# Patient Record
Sex: Male | Born: 1960 | Race: Black or African American | Hispanic: No | Smoking: Former smoker
Health system: Southern US, Community
[De-identification: ages and names within clinical notes are randomized; demographics above are authoritative.]

## PROBLEM LIST (undated history)

## (undated) DIAGNOSIS — I10 Essential (primary) hypertension: Secondary | ICD-10-CM

## (undated) DIAGNOSIS — K8020 Calculus of gallbladder without cholecystitis without obstruction: Secondary | ICD-10-CM

## (undated) DIAGNOSIS — K264 Chronic or unspecified duodenal ulcer with hemorrhage: Secondary | ICD-10-CM

## (undated) DIAGNOSIS — E119 Type 2 diabetes mellitus without complications: Secondary | ICD-10-CM

## (undated) HISTORY — PX: FRACTURE SURGERY: SHX138

---

## 1998-06-01 ENCOUNTER — Inpatient Hospital Stay (HOSPITAL_COMMUNITY): Admission: AD | Admit: 1998-06-01 | Discharge: 1998-06-04 | Payer: Self-pay | Admitting: Internal Medicine

## 1998-10-20 ENCOUNTER — Encounter: Payer: Self-pay | Admitting: Emergency Medicine

## 1998-10-20 ENCOUNTER — Observation Stay (HOSPITAL_COMMUNITY): Admission: EM | Admit: 1998-10-20 | Discharge: 1998-10-21 | Payer: Self-pay | Admitting: Emergency Medicine

## 1998-10-20 ENCOUNTER — Encounter: Payer: Self-pay | Admitting: Specialist

## 1998-11-28 ENCOUNTER — Encounter: Payer: Self-pay | Admitting: *Deleted

## 1998-11-28 ENCOUNTER — Emergency Department (HOSPITAL_COMMUNITY): Admission: EM | Admit: 1998-11-28 | Discharge: 1998-11-28 | Payer: Self-pay | Admitting: *Deleted

## 1999-01-03 ENCOUNTER — Encounter: Admission: RE | Admit: 1999-01-03 | Discharge: 1999-04-03 | Payer: Self-pay | Admitting: Specialist

## 1999-01-12 ENCOUNTER — Encounter: Admission: RE | Admit: 1999-01-12 | Discharge: 1999-03-29 | Payer: Self-pay | Admitting: Specialist

## 1999-01-17 ENCOUNTER — Ambulatory Visit (HOSPITAL_COMMUNITY): Admission: RE | Admit: 1999-01-17 | Discharge: 1999-01-17 | Payer: Self-pay | Admitting: Specialist

## 1999-01-17 ENCOUNTER — Encounter: Payer: Self-pay | Admitting: Specialist

## 1999-03-23 ENCOUNTER — Encounter: Payer: Self-pay | Admitting: Specialist

## 1999-03-23 ENCOUNTER — Ambulatory Visit (HOSPITAL_COMMUNITY): Admission: RE | Admit: 1999-03-23 | Discharge: 1999-03-23 | Payer: Self-pay | Admitting: Specialist

## 1999-06-07 ENCOUNTER — Encounter: Admission: RE | Admit: 1999-06-07 | Discharge: 1999-08-02 | Payer: Self-pay | Admitting: Specialist

## 1999-07-21 ENCOUNTER — Encounter: Admission: RE | Admit: 1999-07-21 | Discharge: 1999-08-02 | Payer: Self-pay | Admitting: Specialist

## 1999-09-28 ENCOUNTER — Emergency Department (HOSPITAL_COMMUNITY): Admission: EM | Admit: 1999-09-28 | Discharge: 1999-09-28 | Payer: Self-pay | Admitting: Emergency Medicine

## 2004-09-06 ENCOUNTER — Emergency Department (HOSPITAL_COMMUNITY): Admission: EM | Admit: 2004-09-06 | Discharge: 2004-09-06 | Payer: Self-pay | Admitting: Emergency Medicine

## 2006-04-14 ENCOUNTER — Emergency Department (HOSPITAL_COMMUNITY): Admission: EM | Admit: 2006-04-14 | Discharge: 2006-04-14 | Payer: Self-pay | Admitting: Emergency Medicine

## 2006-10-31 ENCOUNTER — Inpatient Hospital Stay (HOSPITAL_COMMUNITY): Admission: AC | Admit: 2006-10-31 | Discharge: 2006-11-04 | Payer: Self-pay

## 2010-08-14 ENCOUNTER — Emergency Department (HOSPITAL_COMMUNITY): Admission: EM | Admit: 2010-08-14 | Discharge: 2010-08-14 | Payer: Self-pay | Admitting: Emergency Medicine

## 2010-08-17 ENCOUNTER — Emergency Department (HOSPITAL_COMMUNITY): Admission: EM | Admit: 2010-08-17 | Discharge: 2010-08-17 | Payer: Self-pay | Admitting: Emergency Medicine

## 2010-12-13 LAB — DIFFERENTIAL
Basophils Absolute: 0 10*3/uL (ref 0.0–0.1)
Basophils Relative: 0 % (ref 0–1)
Eosinophils Relative: 0 % (ref 0–5)
Monocytes Absolute: 0.8 10*3/uL (ref 0.1–1.0)
Monocytes Relative: 5 % (ref 3–12)

## 2010-12-13 LAB — CBC
HCT: 44.1 % (ref 39.0–52.0)
Hemoglobin: 15.3 g/dL (ref 13.0–17.0)
MCHC: 34.7 g/dL (ref 30.0–36.0)
MCV: 97.5 fL (ref 78.0–100.0)
RDW: 12.3 % (ref 11.5–15.5)

## 2010-12-13 LAB — BASIC METABOLIC PANEL
BUN: 16 mg/dL (ref 6–23)
CO2: 29 mEq/L (ref 19–32)
Chloride: 102 mEq/L (ref 96–112)
Glucose, Bld: 163 mg/dL — ABNORMAL HIGH (ref 70–99)
Potassium: 4.1 mEq/L (ref 3.5–5.1)
Sodium: 143 mEq/L (ref 135–145)

## 2011-01-01 ENCOUNTER — Emergency Department (HOSPITAL_COMMUNITY): Payer: Self-pay

## 2011-01-01 ENCOUNTER — Inpatient Hospital Stay (HOSPITAL_COMMUNITY)
Admission: EM | Admit: 2011-01-01 | Discharge: 2011-01-02 | DRG: 066 | Disposition: A | Discharge status: Home / Self Care | Payer: Self-pay | Attending: Infectious Diseases | Admitting: Infectious Diseases

## 2011-01-01 ENCOUNTER — Other Ambulatory Visit (HOSPITAL_COMMUNITY): Payer: Self-pay

## 2011-01-01 ENCOUNTER — Encounter: Payer: Self-pay | Admitting: Internal Medicine

## 2011-01-01 DIAGNOSIS — Z7982 Long term (current) use of aspirin: Secondary | ICD-10-CM

## 2011-01-01 DIAGNOSIS — I6529 Occlusion and stenosis of unspecified carotid artery: Secondary | ICD-10-CM | POA: Diagnosis present

## 2011-01-01 DIAGNOSIS — E119 Type 2 diabetes mellitus without complications: Secondary | ICD-10-CM

## 2011-01-01 DIAGNOSIS — I10 Essential (primary) hypertension: Secondary | ICD-10-CM | POA: Diagnosis present

## 2011-01-01 DIAGNOSIS — I6350 Cerebral infarction due to unspecified occlusion or stenosis of unspecified cerebral artery: Secondary | ICD-10-CM

## 2011-01-01 DIAGNOSIS — I517 Cardiomegaly: Secondary | ICD-10-CM | POA: Diagnosis present

## 2011-01-01 DIAGNOSIS — E876 Hypokalemia: Secondary | ICD-10-CM | POA: Diagnosis present

## 2011-01-01 DIAGNOSIS — E785 Hyperlipidemia, unspecified: Secondary | ICD-10-CM | POA: Diagnosis present

## 2011-01-01 LAB — GLUCOSE, CAPILLARY
Glucose-Capillary: 176 mg/dL — ABNORMAL HIGH (ref 70–99)
Glucose-Capillary: 270 mg/dL — ABNORMAL HIGH (ref 70–99)

## 2011-01-01 LAB — URINALYSIS, ROUTINE W REFLEX MICROSCOPIC
Bilirubin Urine: NEGATIVE
Glucose, UA: >1000 mg/dL — AB
Ketones, ur: NEGATIVE mg/dL
Protein, ur: >300 mg/dL — AB

## 2011-01-01 LAB — CBC
HCT: 43.4 % (ref 39.0–52.0)
MCH: 33.2 pg (ref 26.0–34.0)
MCV: 94.1 fL (ref 78.0–100.0)
Platelets: 312 10*3/uL (ref 150–400)
RBC: 4.61 MIL/uL (ref 4.22–5.81)
WBC: 7.1 10*3/uL (ref 4.0–10.5)

## 2011-01-01 LAB — COMPREHENSIVE METABOLIC PANEL
AST: 27 U/L (ref 0–37)
Albumin: 3.3 g/dL — ABNORMAL LOW (ref 3.5–5.2)
BUN: 14 mg/dL (ref 6–23)
Calcium: 8.9 mg/dL (ref 8.4–10.5)
Creatinine, Ser: 1.49 mg/dL (ref 0.4–1.5)
GFR calc Af Amer: >60 mL/min (ref >60)
Total Bilirubin: 1 mg/dL (ref 0.3–1.2)
Total Protein: 6.7 g/dL (ref 6.0–8.3)

## 2011-01-01 LAB — DIFFERENTIAL
Eosinophils Absolute: 0.1 10*3/uL (ref 0.0–0.7)
Eosinophils Relative: 1 % (ref 0–5)
Lymphocytes Relative: 27 % (ref 12–46)
Lymphs Abs: 1.9 10*3/uL (ref 0.7–4.0)
Monocytes Relative: 8 % (ref 3–12)
Neutrophils Relative %: 63 % (ref 43–77)

## 2011-01-01 LAB — URINE MICROSCOPIC-ADD ON

## 2011-01-01 LAB — POCT CARDIAC MARKERS
CKMB, poc: 1.3 ng/mL (ref 1.0–8.0)
Troponin i, poc: <0.05 ng/mL (ref 0.00–0.09)

## 2011-01-01 LAB — APTT: aPTT: 28 seconds (ref 24–37)

## 2011-01-01 LAB — PROTIME-INR
INR: 0.93 (ref 0.00–1.49)
Prothrombin Time: 12.7 seconds (ref 11.6–15.2)
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
## 2011-01-02 ENCOUNTER — Inpatient Hospital Stay (HOSPITAL_COMMUNITY): Payer: Self-pay

## 2011-01-02 DIAGNOSIS — I6350 Cerebral infarction due to unspecified occlusion or stenosis of unspecified cerebral artery: Secondary | ICD-10-CM

## 2011-01-02 LAB — BASIC METABOLIC PANEL
Calcium: 8.4 mg/dL (ref 8.4–10.5)
GFR calc non Af Amer: 59 mL/min — ABNORMAL LOW (ref >60)
Glucose, Bld: 194 mg/dL — ABNORMAL HIGH (ref 70–99)
Sodium: 135 mEq/L (ref 135–145)

## 2011-01-02 LAB — CBC
HCT: 42.7 % (ref 39.0–52.0)
MCHC: 35.4 g/dL (ref 30.0–36.0)
Platelets: 320 10*3/uL (ref 150–400)
RDW: 12 % (ref 11.5–15.5)

## 2011-01-02 LAB — GLUCOSE, CAPILLARY
Glucose-Capillary: 175 mg/dL — ABNORMAL HIGH (ref 70–99)
Glucose-Capillary: 254 mg/dL — ABNORMAL HIGH (ref 70–99)

## 2011-01-02 LAB — HEMOGLOBIN A1C: Mean Plasma Glucose: 203 mg/dL — ABNORMAL HIGH (ref <117)

## 2011-01-02 LAB — LIPID PANEL
Cholesterol: 211 mg/dL — ABNORMAL HIGH (ref 0–200)
Triglycerides: 148 mg/dL (ref <150)

## 2011-01-02 LAB — MAGNESIUM: Magnesium: 1.8 mg/dL (ref 1.5–2.5)

## 2011-01-02 MED ORDER — GADOBENATE DIMEGLUMINE 529 MG/ML IV SOLN
20.0000 mL | Freq: Once | INTRAVENOUS | Status: AC
  Administered 2011-01-02: 20 mL via INTRAVENOUS

## 2011-01-03 MED ORDER — GADOBENATE DIMEGLUMINE 529 MG/ML IV SOLN
20.0000 mL | Freq: Once | INTRAVENOUS | Status: AC
  Administered 2011-01-02: 20 mL via INTRAVENOUS

## 2011-01-04 ENCOUNTER — Ambulatory Visit: Payer: Self-pay | Attending: Infectious Diseases

## 2011-01-04 DIAGNOSIS — IMO0001 Reserved for inherently not codable concepts without codable children: Secondary | ICD-10-CM | POA: Insufficient documentation

## 2011-01-04 DIAGNOSIS — I69920 Aphasia following unspecified cerebrovascular disease: Secondary | ICD-10-CM | POA: Insufficient documentation

## 2011-01-10 ENCOUNTER — Ambulatory Visit: Payer: Self-pay

## 2011-01-16 NOTE — Discharge Summary (Signed)
[name], [name] NO.:  [id number]  MEDICAL RECORD NO.:  [id number]           PATIENT TYPE:  I  LOCATION:  3003                         FACILITY:  [facility]  PHYSICIAN:  Fransisco Hertz, M.D.  DATE OF BIRTH:  30-Aug-1961  DATE OF ADMISSION:  01/01/2011 DATE OF DISCHARGE:  01/02/2011                              DISCHARGE SUMMARY   DISCHARGE DIAGNOSES: 1. Left-sided ischemic stroke secondary to occlusion of the left     middle cerebral artery (M2 branch). 2. Hypertension. 3. Diabetes mellitus. 4. Hyperlipidemia. 5. Hypokalemia.  DISCHARGE MEDICATIONS: 1. Acetaminophen 325 mg by mouth every 4 hours as needed. 2. Aspirin 81 mg tablet 2 by mouth daily. 3. Hydrochlorothiazide 12.5 mg by mouth daily. 4. Lisinopril 20 mg tablet by mouth daily. 5. Simvastatin 20 mg daily. 6. Amlodipine 10 mg by mouth daily. 7. Metformin 1000 mg one tablet by mouth twice daily.  DISPOSITION AND FOLLOWUP:  [name] [name] was discharged from [facility] in stable and improved condition.  He will have a followup appointment within 7 days with Dr. Bruna Potter.  At this appointment, they should discuss his symptoms and if they are resolving, he should also evaluate the need for an outpatient transesophageal echocardiogram and hypocoagulable workup.  He should also establish long- term care for his diabetes, hypertension, and hyperlipidemia.  The patient will also receive outpatient speech therapy and his progress with this therapy can also be discussed.  CONSULTATIONS:  Neurology.  PROCEDURES PERFORMED: 1. Head CT on January 01, 2011:  Bilateral vague hypoattenuating lesions     with the largest in the left temporal lobe which were nonspecific     and related to acute or subacute area of infarction. 2. Brain MRI, January 01, 2011:     a.     Acute/subacute infarcts involving the left temporal,      posterior frontal, parietal lobes, along the sylvian fissure.         I.  Extensive white matter disease is present elsewhere.  This             is nonspecific, but raises concern for chronic             microvascular ischemia. 3. Head MRA on January 01, 2011:     a.     Left inferior M2 branch vessel occlusion.  No other acute or      focal lesions are evident. 4. Neck MRA on January 02, 2011:  Focal concentric plaque of the proximal     left internal carotid artery nearing the lumen by approximately 20%     diameter stenosis.  No significant right carotid or vertebral     artery stenosis. 5. A 12-lead EKG on January 02, 2011:  Normal sinus rhythm with left     ventricular hypertrophy.  CHIEF COMPLAINT:  "Trouble finding words."  HISTORY OF PRESENT ILLNESS:  [name] [name] is a 50 year old male with past medical history significant for diabetes and hypertension who presents with a 2-day history of problems finding words.  The patient states that they began suddenly 2 days ago, and  his main complaint is that he can understand questions or statements made to him, but he cannot always find the appropriate words for reply.  During the interview, there were several incidents of the patient substituting words and he appears to be searching for words with great effort.  He feels that this problem is getting better, but decided to go to urgent care today because he had problems with co-workers understand him at work yesterday.  He states that he was sought at the urgent care that he needs to come to the ED because they "could not do anything for him there."  The patient also admits to an accompanying headache over the past couple of days that is not relieved by Tylenol.  He also has started Tylenol PM at night because he has been unable to sleep during the same time.  He has never experienced symptoms like these in the past.  The patient endorses taking medication as prescribed with the exception of metformin, it is prescribed as 1000 mg b.i.d., the patient states that he  checks his blood sugars once every 2-3 days.  Whenever this number is high (in the 190s), he takes metformin, but he does not take it if his blood sugar is "normal" or on days that he has not checked his sugars.  The patient denies numbness, tingling, blurred vision, muscle weakness, or facial droop.  ADMISSION PHYSICAL EXAMINATION:  VITAL SIGNS:  Temperature 97.6, pulse 86, BP 164/92, respiratory rate 18, sats 99% on room air. GENERAL:  The patient is a well-developed and well-nourished man in no acute distress and cooperative with the exam, alert and oriented x3. HEENT:  Eyes:  PERRL, EOMI, conjunctivae normal, no scleral icterus. NECK:  Supple, trachea midline, normal range of motion, no JVD, mass, thyromegaly, or carotid bruits present. CARDIOVASCULAR:  RRR, S1 normal, S2 normal, pulse is symmetric and intact bilaterally. PULMONARY:  CTAB.  No wheezes, rales, or rhonchi. ABDOMEN:  Soft, nontender, nondistended, and bowel sounds are normal with no masses, organomegaly, or guarding present. MUSCULOSKELETAL:  No joint deformities, erythema, or stiffness.  Range of motion full. NEUROLOGIC:  Alert and oriented x3.  Strength is normal and symmetric bilaterally, cranial nerves II through XII are grossly intact with no focal motor deficits and sensory is intact to light touch bilaterally. SKIN:  Warm, dry, and intact.  No rashes, cyanosis, or clubbing. PSYCHIATRIC:  The patient has some trouble with speech as described in the HPI, but otherwise, displays normal mood, affect, and behavior. Cognition and memory are all normal.  ADMISSION LABS:  WBC 7.1, hemoglobin 15.3, hematocrit 43.4, platelet count 312.  CK-MB 1.3.  Troponin I less than 0.05.  Myoglobin 72.8.  Protime (prothrombin time) 12.7.  INR 0.93.  PTT 28.  Alcohol less than 5.  Sodium 138, potassium 3.2, chloride 99, CO2 29, glucose 304, BUN 14, creatinine 1.49, bilirubin total 1.0, alkaline phosphatase 123, SGOT (AST) 27,  SGPT (ALT) 41, total protein 6.7, albumin - blood 3.3, calcium 8.9, magnesium 2.1.  Color, urine yellow, appearance clear.  Specific gravity is 1.032 (high), pH 5.5, urine glucose greater than 1000 (abnormal), bilirubin negative, ketones negative, blood trace (abnormal), protein greater than 300 (abnormal), urobilinogen 0.2, nitrite negative, leukocytes negative, squamous epithelial/LPF rare, wbc's/HPF 0-2, rbc's/HPF 0-2, urine other mucous present.  HOSPITAL COURSE BY PROBLEM LIST: 1. Left brain ischemia:  The patient presented to the ED with a 2-day     history of slurred speech and trouble finding words.  CT, MRI,  and     MRA confirmed that the patient had an infarct involving the left     temporal lobe due to left inferior M2 branch vessel occlusion.  The     patient was started on aspirin and statin therapy to reduce the     likelihood of a secondary thrombotic event, but as he presented     outside of the 24-hour window, we did not start him on thrombolytic     therapy.  EKG and cardiac enzymes were all within normal limits     making arrythmic causes of the infarct less likely.  MRA of the neck     revealed 20% stenosis of left proximal internal carotid artery.     The patient has a several risk factors including uncontrolled hypertension,     hyperlipidemia, and poorly controlled diabetes but given his age and      the size of the occluded vessel a thromboembolic event is possible.       The morning of discharge the patient stated he needed to get home to      go back to work and was not willing to stay to complete the work up.       We would like his primary care doctor to schedule him for an outpatient     TEE and draw testing for hypercoagulability including anti-phospholipid      antibody, syphyllis, etc.  2. Hypokalemia:  Upon admission, the patient's potassium was mildly     decreased and he was replenished appropriately.  This will need to     be followed up in an  outpatient setting and he was started on     hydrochlorothiazide, which could further decrease his potassium     levels. 3. Hypertension (uncontrolled):  Despite current treatment with     amlodipine and clonidine, the patient's blood pressures were     elevated on admission.  The patient's amlodipine was continued and     he was started on lisinopril.  Clonidine was discontinued.     Overnight blood pressures remained elevated, so hydrochlorothiazide     was added and IV fluid resuscitation was discontinued.  This was     goals of therapy with the patient to keep his blood pressure less     than 130/less than 80. 4. Diabetes.  The patient admitted the improper use of metformin prior     to hospitalization.  We discussed with the patient importance of     adherence to his regimen and his elevated hemoglobin A1c resolved.     His diabetes regimen will need to be titrated in an outpatient     setting. 5. Hyperlipidemia.  His lipid panel revealed elevated total     cholesterol and LDL levels.  With the patient's diabetes and     carotid artery disease, LDL should be less than 70 to decrease     cardiac events.  The patient was started on statin therapy with     Crestor.  Upon discharge, change the simvastatin for cost issues.     This will also need followup in an outpatient setting.  DISCHARGE VITALS:  Temperature 98.0, pulse 76, respiratory rate 18, sats 97% on room air.  BP 158/86.  DISCHARGE LABS:  WBCs 6.8, hemoglobin 15.1, hematocrit 42.7, platelet count 320.  Sodium 135, potassium 3.0, chloride 98, CO2 29, BUN 10, creatinine 1.28, glucose 194, calcium 8.4, magnesium 1.8.  Total cholesterol 211, HDL 82, LDL 99, VLDL  30.  Hemoglobin A1c 8.7.    ______________________________ Leodis Sias, MD   ______________________________ Fransisco Hertz, M.D.    CP/MEDQ  D:  01/02/2011  T:  01/03/2011  Job:  [id number]  cc:   Clyda Greener, M.D.  Electronically Signed by  Leodis Sias MD on 01/03/2011 12:25:21 PM Electronically Signed by Lina Sayre M.D. on 01/16/2011 12:35:43 PM

## 2011-01-25 ENCOUNTER — Other Ambulatory Visit: Payer: Self-pay | Admitting: Internal Medicine

## 2011-01-25 NOTE — Telephone Encounter (Signed)
This is not OPC pt.; can u send this request back to the pharmacy. He will be seeing Dr. Bruna Potter per d/c summary in E-chart.
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
## 2012-09-11 ENCOUNTER — Emergency Department (HOSPITAL_COMMUNITY)
Admission: EM | Admit: 2012-09-11 | Discharge: 2012-09-11 | Disposition: A | Discharge status: Home / Self Care | Payer: No Typology Code available for payment source | Attending: Emergency Medicine | Admitting: Emergency Medicine

## 2012-09-11 ENCOUNTER — Emergency Department (HOSPITAL_COMMUNITY): Payer: No Typology Code available for payment source

## 2012-09-11 ENCOUNTER — Encounter (HOSPITAL_COMMUNITY): Payer: Self-pay | Admitting: *Deleted

## 2012-09-11 DIAGNOSIS — Y92410 Unspecified street and highway as the place of occurrence of the external cause: Secondary | ICD-10-CM | POA: Insufficient documentation

## 2012-09-11 DIAGNOSIS — S99929A Unspecified injury of unspecified foot, initial encounter: Secondary | ICD-10-CM | POA: Insufficient documentation

## 2012-09-11 DIAGNOSIS — S46909A Unspecified injury of unspecified muscle, fascia and tendon at shoulder and upper arm level, unspecified arm, initial encounter: Secondary | ICD-10-CM | POA: Insufficient documentation

## 2012-09-11 DIAGNOSIS — S8990XA Unspecified injury of unspecified lower leg, initial encounter: Secondary | ICD-10-CM | POA: Insufficient documentation

## 2012-09-11 DIAGNOSIS — S161XXA Strain of muscle, fascia and tendon at neck level, initial encounter: Secondary | ICD-10-CM

## 2012-09-11 DIAGNOSIS — I10 Essential (primary) hypertension: Secondary | ICD-10-CM | POA: Insufficient documentation

## 2012-09-11 DIAGNOSIS — Y9389 Activity, other specified: Secondary | ICD-10-CM | POA: Insufficient documentation

## 2012-09-11 DIAGNOSIS — S139XXA Sprain of joints and ligaments of unspecified parts of neck, initial encounter: Secondary | ICD-10-CM | POA: Insufficient documentation

## 2012-09-11 DIAGNOSIS — S0990XA Unspecified injury of head, initial encounter: Secondary | ICD-10-CM | POA: Insufficient documentation

## 2012-09-11 DIAGNOSIS — E119 Type 2 diabetes mellitus without complications: Secondary | ICD-10-CM | POA: Insufficient documentation

## 2012-09-11 DIAGNOSIS — S4980XA Other specified injuries of shoulder and upper arm, unspecified arm, initial encounter: Secondary | ICD-10-CM | POA: Insufficient documentation

## 2012-09-11 DIAGNOSIS — Z79899 Other long term (current) drug therapy: Secondary | ICD-10-CM | POA: Insufficient documentation

## 2012-09-11 DIAGNOSIS — R404 Transient alteration of awareness: Secondary | ICD-10-CM | POA: Insufficient documentation

## 2012-09-11 HISTORY — DX: Essential (primary) hypertension: I10

## 2012-09-11 HISTORY — DX: Type 2 diabetes mellitus without complications: E11.9

## 2012-09-11 MED ORDER — OXYCODONE-ACETAMINOPHEN 5-325 MG PO TABS
1.0000 | ORAL_TABLET | Freq: Once | ORAL | Status: AC
  Administered 2012-09-11: 1 via ORAL
  Filled 2012-09-11: qty 1

## 2012-09-11 MED ORDER — CYCLOBENZAPRINE HCL 10 MG PO TABS: 10.0000 mg | ORAL_TABLET | Freq: Two times a day (BID) | ORAL | Status: DC | PRN

## 2012-09-11 MED ORDER — OXYCODONE-ACETAMINOPHEN 5-325 MG PO TABS: 1.0000 | ORAL_TABLET | Freq: Four times a day (QID) | ORAL | Status: DC | PRN

## 2012-09-11 MED ORDER — IBUPROFEN 400 MG PO TABS
600.0000 mg | ORAL_TABLET | Freq: Once | ORAL | Status: AC
  Administered 2012-09-11: 600 mg via ORAL
  Filled 2012-09-11: qty 2

## 2012-09-11 MED ORDER — IBUPROFEN 400 MG PO TABS: 400.0000 mg | ORAL_TABLET | Freq: Four times a day (QID) | ORAL | Status: DC | PRN

## 2012-09-11 MED ORDER — DIAZEPAM 5 MG PO TABS
5.0000 mg | ORAL_TABLET | Freq: Once | ORAL | Status: AC
  Administered 2012-09-11: 5 mg via ORAL
  Filled 2012-09-11: qty 1
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
## 2012-09-11 NOTE — ED Notes (Signed)
Acuity changed to 3 due to pt report of possible LOC

## 2012-09-11 NOTE — ED Notes (Signed)
Patient transported to CT 
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
## 2012-09-23 ENCOUNTER — Encounter (HOSPITAL_COMMUNITY): Payer: Self-pay | Admitting: *Deleted

## 2012-09-23 ENCOUNTER — Emergency Department (HOSPITAL_COMMUNITY)
Admission: EM | Admit: 2012-09-23 | Discharge: 2012-09-23 | Disposition: A | Discharge status: Home / Self Care | Payer: No Typology Code available for payment source | Attending: Emergency Medicine | Admitting: Emergency Medicine

## 2012-09-23 DIAGNOSIS — Y92410 Unspecified street and highway as the place of occurrence of the external cause: Secondary | ICD-10-CM | POA: Insufficient documentation

## 2012-09-23 DIAGNOSIS — S161XXA Strain of muscle, fascia and tendon at neck level, initial encounter: Secondary | ICD-10-CM

## 2012-09-23 DIAGNOSIS — Y9389 Activity, other specified: Secondary | ICD-10-CM | POA: Insufficient documentation

## 2012-09-23 DIAGNOSIS — I10 Essential (primary) hypertension: Secondary | ICD-10-CM | POA: Insufficient documentation

## 2012-09-23 DIAGNOSIS — F0781 Postconcussional syndrome: Secondary | ICD-10-CM | POA: Insufficient documentation

## 2012-09-23 DIAGNOSIS — R209 Unspecified disturbances of skin sensation: Secondary | ICD-10-CM | POA: Insufficient documentation

## 2012-09-23 DIAGNOSIS — R51 Headache: Secondary | ICD-10-CM | POA: Insufficient documentation

## 2012-09-23 DIAGNOSIS — Z79899 Other long term (current) drug therapy: Secondary | ICD-10-CM | POA: Insufficient documentation

## 2012-09-23 DIAGNOSIS — Z87891 Personal history of nicotine dependence: Secondary | ICD-10-CM | POA: Insufficient documentation

## 2012-09-23 DIAGNOSIS — S139XXA Sprain of joints and ligaments of unspecified parts of neck, initial encounter: Secondary | ICD-10-CM | POA: Insufficient documentation

## 2012-09-23 DIAGNOSIS — E119 Type 2 diabetes mellitus without complications: Secondary | ICD-10-CM | POA: Insufficient documentation
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
## 2012-10-01 ENCOUNTER — Encounter (HOSPITAL_COMMUNITY): Payer: Self-pay | Admitting: *Deleted

## 2012-10-01 ENCOUNTER — Emergency Department (HOSPITAL_COMMUNITY)
Admission: EM | Admit: 2012-10-01 | Discharge: 2012-10-01 | Disposition: A | Discharge status: Home / Self Care | Payer: No Typology Code available for payment source | Attending: Emergency Medicine | Admitting: Emergency Medicine

## 2012-10-01 ENCOUNTER — Ambulatory Visit: Payer: No Typology Code available for payment source | Attending: Internal Medicine | Admitting: Physical Therapy

## 2012-10-01 DIAGNOSIS — R51 Headache: Secondary | ICD-10-CM

## 2012-10-01 DIAGNOSIS — M542 Cervicalgia: Secondary | ICD-10-CM

## 2012-10-01 DIAGNOSIS — R42 Dizziness and giddiness: Secondary | ICD-10-CM | POA: Insufficient documentation

## 2012-10-01 DIAGNOSIS — M1990 Unspecified osteoarthritis, unspecified site: Secondary | ICD-10-CM | POA: Insufficient documentation

## 2012-10-01 DIAGNOSIS — M47812 Spondylosis without myelopathy or radiculopathy, cervical region: Secondary | ICD-10-CM

## 2012-10-01 DIAGNOSIS — I10 Essential (primary) hypertension: Secondary | ICD-10-CM | POA: Insufficient documentation

## 2012-10-01 DIAGNOSIS — Z87891 Personal history of nicotine dependence: Secondary | ICD-10-CM | POA: Insufficient documentation

## 2012-10-01 DIAGNOSIS — Z79899 Other long term (current) drug therapy: Secondary | ICD-10-CM | POA: Diagnosis not present

## 2012-10-01 DIAGNOSIS — E119 Type 2 diabetes mellitus without complications: Secondary | ICD-10-CM | POA: Insufficient documentation

## 2012-10-01 MED ORDER — OXYCODONE-ACETAMINOPHEN 5-325 MG PO TABS: 1.0000 | ORAL_TABLET | ORAL | Status: DC | PRN
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
## 2012-10-01 NOTE — ED Notes (Signed)
Patient up to the rest room to collect a urine

## 2012-10-01 NOTE — ED Notes (Signed)
Patient discharged using teach back method with Rx and a note to be out of work until 10/08/11. He verbalizes an understanding NAD noted at the time of discharge.

## 2012-10-08 ENCOUNTER — Ambulatory Visit: Payer: No Typology Code available for payment source | Attending: Internal Medicine | Admitting: Rehabilitation

## 2012-10-08 DIAGNOSIS — M2550 Pain in unspecified joint: Secondary | ICD-10-CM | POA: Insufficient documentation

## 2012-10-08 DIAGNOSIS — M2560 Stiffness of unspecified joint, not elsewhere classified: Secondary | ICD-10-CM | POA: Insufficient documentation

## 2012-10-08 DIAGNOSIS — IMO0001 Reserved for inherently not codable concepts without codable children: Secondary | ICD-10-CM | POA: Insufficient documentation

## 2012-10-15 ENCOUNTER — Ambulatory Visit: Payer: No Typology Code available for payment source | Admitting: Physical Therapy

## 2012-10-17 ENCOUNTER — Ambulatory Visit: Payer: No Typology Code available for payment source | Admitting: Physical Therapy

## 2012-10-22 ENCOUNTER — Ambulatory Visit: Payer: No Typology Code available for payment source | Admitting: Physical Therapy

## 2012-10-24 ENCOUNTER — Ambulatory Visit: Payer: No Typology Code available for payment source | Admitting: Physical Therapy

## 2012-10-29 ENCOUNTER — Ambulatory Visit: Payer: No Typology Code available for payment source | Admitting: Rehabilitative and Restorative Service Providers"

## 2012-10-31 ENCOUNTER — Ambulatory Visit: Payer: No Typology Code available for payment source | Admitting: Physical Therapy

## 2012-11-05 ENCOUNTER — Ambulatory Visit: Payer: BC Managed Care – PPO | Attending: Internal Medicine | Admitting: Physical Therapy

## 2012-11-05 ENCOUNTER — Other Ambulatory Visit: Payer: Self-pay | Admitting: Neurology

## 2012-11-05 DIAGNOSIS — F0781 Postconcussional syndrome: Secondary | ICD-10-CM

## 2012-11-05 DIAGNOSIS — M2550 Pain in unspecified joint: Secondary | ICD-10-CM | POA: Insufficient documentation

## 2012-11-05 DIAGNOSIS — M2560 Stiffness of unspecified joint, not elsewhere classified: Secondary | ICD-10-CM | POA: Insufficient documentation

## 2012-11-05 DIAGNOSIS — M542 Cervicalgia: Secondary | ICD-10-CM

## 2012-11-05 DIAGNOSIS — IMO0001 Reserved for inherently not codable concepts without codable children: Secondary | ICD-10-CM | POA: Insufficient documentation

## 2012-11-07 ENCOUNTER — Ambulatory Visit: Payer: BC Managed Care – PPO | Admitting: Physical Therapy

## 2012-11-07 ENCOUNTER — Ambulatory Visit
Admission: RE | Admit: 2012-11-07 | Discharge: 2012-11-07 | Disposition: A | Discharge status: Home / Self Care | Payer: BC Managed Care – PPO | Source: Ambulatory Visit | Attending: Neurology | Admitting: Neurology

## 2012-11-07 DIAGNOSIS — M542 Cervicalgia: Secondary | ICD-10-CM

## 2012-11-07 DIAGNOSIS — F0781 Postconcussional syndrome: Secondary | ICD-10-CM

## 2012-11-12 ENCOUNTER — Ambulatory Visit: Payer: BC Managed Care – PPO | Admitting: Physical Therapy

## 2012-11-14 ENCOUNTER — Ambulatory Visit: Payer: BC Managed Care – PPO | Admitting: Physical Therapy

## 2012-11-19 ENCOUNTER — Ambulatory Visit: Payer: BC Managed Care – PPO | Admitting: Physical Therapy

## 2012-11-21 ENCOUNTER — Ambulatory Visit: Payer: BC Managed Care – PPO | Admitting: Physical Therapy

## 2012-12-02 ENCOUNTER — Ambulatory Visit: Payer: BC Managed Care – PPO | Attending: Internal Medicine | Admitting: Physical Therapy

## 2012-12-02 DIAGNOSIS — M2550 Pain in unspecified joint: Secondary | ICD-10-CM | POA: Diagnosis not present

## 2012-12-02 DIAGNOSIS — IMO0001 Reserved for inherently not codable concepts without codable children: Secondary | ICD-10-CM | POA: Diagnosis not present

## 2012-12-02 DIAGNOSIS — M2560 Stiffness of unspecified joint, not elsewhere classified: Secondary | ICD-10-CM | POA: Diagnosis not present

## 2013-01-20 ENCOUNTER — Emergency Department (HOSPITAL_COMMUNITY)
Admission: EM | Admit: 2013-01-20 | Discharge: 2013-01-20 | Disposition: A | Discharge status: Home / Self Care | Payer: BC Managed Care – PPO | Attending: Emergency Medicine | Admitting: Emergency Medicine

## 2013-01-20 ENCOUNTER — Encounter (HOSPITAL_COMMUNITY): Payer: Self-pay | Admitting: *Deleted

## 2013-01-20 ENCOUNTER — Emergency Department (HOSPITAL_COMMUNITY): Payer: BC Managed Care – PPO

## 2013-01-20 DIAGNOSIS — I10 Essential (primary) hypertension: Secondary | ICD-10-CM | POA: Insufficient documentation

## 2013-01-20 DIAGNOSIS — E119 Type 2 diabetes mellitus without complications: Secondary | ICD-10-CM | POA: Insufficient documentation

## 2013-01-20 DIAGNOSIS — R51 Headache: Secondary | ICD-10-CM | POA: Insufficient documentation

## 2013-01-20 DIAGNOSIS — Z87891 Personal history of nicotine dependence: Secondary | ICD-10-CM | POA: Insufficient documentation

## 2013-01-20 DIAGNOSIS — Z79899 Other long term (current) drug therapy: Secondary | ICD-10-CM | POA: Insufficient documentation

## 2013-01-20 DIAGNOSIS — F0781 Postconcussional syndrome: Secondary | ICD-10-CM

## 2013-01-20 LAB — CBC WITH DIFFERENTIAL/PLATELET
Basophils Relative: 0 % (ref 0–1)
Eosinophils Absolute: 0.1 10*3/uL (ref 0.0–0.7)
Eosinophils Relative: 2 % (ref 0–5)
Hemoglobin: 12.4 g/dL — ABNORMAL LOW (ref 13.0–17.0)
MCH: 31.7 pg (ref 26.0–34.0)
MCHC: 34 g/dL (ref 30.0–36.0)
Monocytes Relative: 6 % (ref 3–12)
Neutrophils Relative %: 70 % (ref 43–77)
Platelets: 253 10*3/uL (ref 150–400)

## 2013-01-20 LAB — BASIC METABOLIC PANEL
BUN: 13 mg/dL (ref 6–23)
Calcium: 9.2 mg/dL (ref 8.4–10.5)
GFR calc Af Amer: 66 mL/min — ABNORMAL LOW (ref >90)
GFR calc non Af Amer: 57 mL/min — ABNORMAL LOW (ref >90)
Potassium: 4.2 mEq/L (ref 3.5–5.1)

## 2013-01-20 MED ORDER — DEXAMETHASONE SODIUM PHOSPHATE 10 MG/ML IJ SOLN
10.0000 mg | Freq: Once | INTRAMUSCULAR | Status: AC
  Administered 2013-01-20: 10 mg via INTRAVENOUS
  Filled 2013-01-20: qty 1

## 2013-01-20 MED ORDER — HYDROCODONE-ACETAMINOPHEN 5-325 MG PO TABS: 1.0000 | ORAL_TABLET | Freq: Four times a day (QID) | ORAL | Status: DC | PRN

## 2013-01-20 MED ORDER — SODIUM CHLORIDE 0.9 % IV SOLN
INTRAVENOUS | Status: DC
  Administered 2013-01-20: 13:00:00 via INTRAVENOUS

## 2013-01-20 MED ORDER — DIPHENHYDRAMINE HCL 50 MG/ML IJ SOLN
25.0000 mg | Freq: Once | INTRAMUSCULAR | Status: AC
  Administered 2013-01-20: 25 mg via INTRAVENOUS
  Filled 2013-01-20: qty 1

## 2013-01-20 MED ORDER — PROMETHAZINE HCL 25 MG/ML IJ SOLN
12.5000 mg | Freq: Once | INTRAMUSCULAR | Status: AC
  Administered 2013-01-20: 25 mg via INTRAVENOUS
  Filled 2013-01-20: qty 1

## 2013-01-20 MED ORDER — SODIUM CHLORIDE 0.9 % IV BOLUS (SEPSIS)
1000.0000 mL | Freq: Once | INTRAVENOUS | Status: AC
  Administered 2013-01-20: 1000 mL via INTRAVENOUS
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
## 2013-01-20 NOTE — ED Notes (Signed)
Deretha Emory, Md & Charge RN notified re: 25 mg Phenergan IV medication given when 12.5 mg Phenergan was ordered, informed to monitor for mood changes frequently described as a since of doom, pt informed he received a medication that could cause a change in his mood, pt verbalized understanding, pt placed on monitor, will continue to monitor the pt
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
## 2013-02-12 ENCOUNTER — Encounter: Payer: Self-pay | Admitting: Neurology

## 2013-02-12 ENCOUNTER — Ambulatory Visit (INDEPENDENT_AMBULATORY_CARE_PROVIDER_SITE_OTHER): Payer: BC Managed Care – PPO | Admitting: Neurology

## 2013-02-12 VITALS — BP 188/98 | HR 74 | Temp 98.8°F | Ht 69.0 in | Wt 160.0 lb

## 2013-02-12 DIAGNOSIS — G44309 Post-traumatic headache, unspecified, not intractable: Secondary | ICD-10-CM

## 2013-02-12 DIAGNOSIS — F0781 Postconcussional syndrome: Secondary | ICD-10-CM

## 2013-02-12 DIAGNOSIS — M542 Cervicalgia: Secondary | ICD-10-CM

## 2013-02-12 MED ORDER — DIVALPROEX SODIUM ER 250 MG PO TB24: 250.0000 mg | ORAL_TABLET | Freq: Every day | ORAL | Status: DC
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
## 2013-02-12 NOTE — Patient Instructions (Addendum)
Please remember, common headache triggers are: sleep deprivation, dehydration, overheating, stress, hypoglycemia or skipping meals, excessive pain medications or excessive alcohol use. Some people have food triggers such as aged cheese, orange juice or chocolate, especially dark chocolate. Try to avoid these headache triggers as much possible. It may be helpful to keep a headache diary to figure out what makes your headaches worse or brings them on. Some people report headache onset after exercise but studies have shown that regular exercise may actually prevent headaches from coming on. If you have exercise-induced headaches, please make sure that you drink plenty of fluid before and after exercising and that you don't over do it.  Please make sure that you drink plenty of fluids. I would like for you to exercise daily for example in the form of walking 20-30 minutes every day, if you can. Please keep a regular sleep-wake schedule, keep regular meal times, do not skip any meals, eat  healthy snacks in between meals, such as fruit or nuts. Try to eat protein with every meal.   As far as your medication: I will start you on Depakote ER 250 mg: take one pill each night.   I will see you back in 4 months, sooner if we need to. Please call us if you have any interim questions, concerns, or problems or updates to need to discuss.  Brett Canales is my clinical assistant and will answer any of your questions and relay your messages to me and will give you my messages.   Our phone number is [phone]. We also have an after hours call service for urgent matters and there is a physician on-call for urgent questions. For any emergencies you know to call 911 or go to the nearest emergency room.

## 2013-06-16 ENCOUNTER — Encounter: Payer: Self-pay | Admitting: Neurology

## 2013-06-16 ENCOUNTER — Ambulatory Visit (INDEPENDENT_AMBULATORY_CARE_PROVIDER_SITE_OTHER): Payer: BC Managed Care – PPO | Admitting: Neurology

## 2013-06-16 VITALS — BP 152/82 | HR 62 | Temp 98.8°F | Ht 69.0 in | Wt 161.0 lb

## 2013-06-16 DIAGNOSIS — M542 Cervicalgia: Secondary | ICD-10-CM

## 2013-06-16 DIAGNOSIS — G44309 Post-traumatic headache, unspecified, not intractable: Secondary | ICD-10-CM

## 2013-06-16 DIAGNOSIS — F0781 Postconcussional syndrome: Secondary | ICD-10-CM

## 2013-06-16 MED ORDER — DIVALPROEX SODIUM ER 500 MG PO TB24: 500.0000 mg | ORAL_TABLET | Freq: Every day | ORAL | Status: DC
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
## 2013-06-16 NOTE — Patient Instructions (Addendum)
Please remember, common headache triggers are: sleep deprivation, dehydration, overheating, stress, hypoglycemia or skipping meals and blood sugar fluctuations, excessive pain medications or excessive alcohol use or caffeine withdrawal. Some people have food triggers such as aged cheese, orange juice or chocolate, especially dark chocolate, or MSG (monosodium glutamate). Try to avoid these headache triggers as much possible. It may be helpful to keep a headache diary to figure out what makes your headaches worse or brings them on and what alleviates them. Some people report headache onset after exercise but studies have shown that regular exercise may actually prevent headaches from coming. If you have exercise-induced headaches, please make sure that you drink plenty of fluid before and after exercising and that you do not over do it and do not overheat. Please remember to try to maintain good sleep hygiene, which means: Keep a regular sleep and wake schedule, try not to exercise or have a meal within 2 hours of your bedtime, try to keep your bedroom conducive for sleep, that is, cool and dark, without light distractors such as an illuminated alarm clock, and refrain from watching TV right before sleep or in the middle of the night and do not keep the TV or radio on during the night. Also, try not to use or play on electronic devices at bedtime, such as your cell phone, tablet PC or laptop. If you like to read at bedtime on an electronic device, try to dim the background light as much as possible.  I would advise you not to take Fioricet daily, it can cause withdrawal headaches or rebound headaches. I will increase the Depakote ER to 500mg  each night. Be mindful, that it can cause sedation. Do not drive at night, after taking Depakote.

## 2013-09-29 ENCOUNTER — Emergency Department (HOSPITAL_COMMUNITY)
Admission: EM | Admit: 2013-09-29 | Discharge: 2013-09-29 | Disposition: A | Discharge status: Home / Self Care | Payer: BC Managed Care – PPO | Attending: Emergency Medicine | Admitting: Emergency Medicine

## 2013-09-29 ENCOUNTER — Encounter (HOSPITAL_COMMUNITY): Payer: Self-pay | Admitting: Emergency Medicine

## 2013-09-29 DIAGNOSIS — G8929 Other chronic pain: Secondary | ICD-10-CM | POA: Insufficient documentation

## 2013-09-29 DIAGNOSIS — Z87828 Personal history of other (healed) physical injury and trauma: Secondary | ICD-10-CM | POA: Insufficient documentation

## 2013-09-29 DIAGNOSIS — I10 Essential (primary) hypertension: Secondary | ICD-10-CM | POA: Insufficient documentation

## 2013-09-29 DIAGNOSIS — E119 Type 2 diabetes mellitus without complications: Secondary | ICD-10-CM | POA: Insufficient documentation

## 2013-09-29 DIAGNOSIS — Z79899 Other long term (current) drug therapy: Secondary | ICD-10-CM | POA: Insufficient documentation

## 2013-09-29 DIAGNOSIS — R51 Headache: Secondary | ICD-10-CM | POA: Insufficient documentation

## 2013-09-29 MED ORDER — DEXAMETHASONE SODIUM PHOSPHATE 10 MG/ML IJ SOLN
10.0000 mg | Freq: Once | INTRAMUSCULAR | Status: AC
  Administered 2013-09-29: 10 mg via INTRAMUSCULAR

## 2013-09-29 MED ORDER — DEXAMETHASONE SODIUM PHOSPHATE 10 MG/ML IJ SOLN
10.0000 mg | Freq: Once | INTRAMUSCULAR | Status: DC
  Filled 2013-09-29: qty 1

## 2013-09-29 MED ORDER — KETOROLAC TROMETHAMINE 30 MG/ML IJ SOLN
60.0000 mg | Freq: Once | INTRAMUSCULAR | Status: AC
  Administered 2013-09-29: 60 mg via INTRAMUSCULAR
  Filled 2013-09-29 (×2): qty 2

## 2013-09-29 MED ORDER — GABAPENTIN 300 MG PO CAPS: ORAL_CAPSULE | ORAL | Status: DC

## 2013-09-29 NOTE — ED Notes (Addendum)
PT reports 7/10 pain in head, starting in the frontal sinus area and radiating to the back of head. PT states the pain "feels like swelling in my head". PT also describes pain as pounding and pressure. PT denies photosensitivity, n/v. PT reports dizziness when standing when the periods of acute pain occur and denies vision changes and denies weakness.

## 2013-09-29 NOTE — ED Notes (Signed)
Pt undressed, in gown, on monitor, continuous pulse oximetry and blood pressure cuff 
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
## 2013-09-30 ENCOUNTER — Telehealth: Payer: Self-pay | Admitting: Neurology

## 2013-09-30 NOTE — Telephone Encounter (Signed)
This patient came into the office today upset. States he went to see his lawyer yesterday and was read a note from Dr. Frances Furbish that he is upset about. He states that she speaks about things that they never spoke about together. He states that she speaks about him drinking and being overly medicated and these things are not true. He would like to have a conversation with Dr. Frances Furbish about her findings and why she stated these things

## 2013-10-06 ENCOUNTER — Telehealth: Payer: Self-pay | Admitting: Neurology

## 2013-10-06 NOTE — Telephone Encounter (Signed)
Patient returning Dr. Guadelupe Sabin call from this morning. Please call back.

## 2013-10-06 NOTE — Telephone Encounter (Signed)
I called and left a message on his voicemail. It was his personal voicemail with his voice on the message. I stated that I would be happy to discuss any notes from our office visits with him. I understand that he was upset about something I have documented in my office note.

## 2013-10-07 NOTE — Telephone Encounter (Signed)
I was able to talk to him today. He stated that his lawyer told him that my records indicate that he drinks alcohol and overdoes it and also drinks caffeine. I stated to him that I talked to him in the past about medication overuse in general, also in particular with Fioricet and Percocet, and that I have not documented or felt the need for documentation that he drinks alcohol. His social history indicates that he does not drink any alcohol in my notes. He does not drink any caffeine and I did not mention that he drinks excess caffeinated drinks in my notes. Fioricet contains caffeine and he was advised of that again today. I suggested that he get his lawyer to show him what documentation the lawyer is referencing and he is welcome to get his office notes from our office after signing a release of information form. He demonstrated understanding and agreement.

## 2013-11-20 ENCOUNTER — Encounter (HOSPITAL_COMMUNITY): Payer: Self-pay | Admitting: Emergency Medicine

## 2013-11-20 ENCOUNTER — Emergency Department (HOSPITAL_COMMUNITY)
Admission: EM | Admit: 2013-11-20 | Discharge: 2013-11-20 | Disposition: A | Discharge status: Home / Self Care | Payer: BC Managed Care – PPO | Attending: Emergency Medicine | Admitting: Emergency Medicine

## 2013-11-20 DIAGNOSIS — Z79899 Other long term (current) drug therapy: Secondary | ICD-10-CM | POA: Insufficient documentation

## 2013-11-20 DIAGNOSIS — I10 Essential (primary) hypertension: Secondary | ICD-10-CM | POA: Insufficient documentation

## 2013-11-20 DIAGNOSIS — E119 Type 2 diabetes mellitus without complications: Secondary | ICD-10-CM | POA: Insufficient documentation

## 2013-11-20 DIAGNOSIS — G8929 Other chronic pain: Secondary | ICD-10-CM

## 2013-11-20 DIAGNOSIS — R51 Headache: Secondary | ICD-10-CM | POA: Insufficient documentation

## 2013-11-20 DIAGNOSIS — Z87891 Personal history of nicotine dependence: Secondary | ICD-10-CM | POA: Insufficient documentation

## 2013-11-20 MED ORDER — DIAZEPAM 5 MG PO TABS
5.0000 mg | ORAL_TABLET | Freq: Once | ORAL | Status: AC
  Administered 2013-11-20: 5 mg via ORAL
  Filled 2013-11-20: qty 1

## 2013-11-20 MED ORDER — ALPRAZOLAM 0.25 MG PO TABS: 0.2500 mg | ORAL_TABLET | Freq: Two times a day (BID) | ORAL | Status: DC | PRN

## 2013-11-20 NOTE — ED Provider Notes (Signed)
CSN: [id number]     Arrival date & time 11/20/13  1341 History   First MD Initiated Contact with Patient 11/20/13 1721     Chief Complaint  Patient presents with  . Headache     (Consider location/radiation/quality/duration/timing/severity/associated sxs/prior Treatment) HPI Comments: Pt normally takes 0.25 mg of xanax by his PCP in addition takes neurontin daily and percocet 10 mg as needed.  He reports HA pain is not that bad but feels like he is having muscle spasms in the side of his head. No new stressors.  Ran out of xanax is only change.  No fevers, chills, no stiff neck, LOC, dizziness, light headed.  No focal weakness or numbness, no blurred vision.    Patient is a 53 y.o. male presenting with headaches. The history is provided by the patient.  Headache Pain location:  L parietal and L temporal Quality:  Sharp (cramping) Radiates to:  Does not radiate Timing:  Constant Progression:  Unchanged Chronicity:  Chronic Similar to prior headaches: yes   Associated symptoms: no dizziness, no fever, no nausea, no neck pain, no neck stiffness and no vomiting     Past Medical History  Diagnosis Date  . Hypertension   . Diabetes mellitus without complication    History reviewed. No pertinent past surgical history. History reviewed. No pertinent family history. History  Substance Use Topics  . Smoking status: Former Research scientist (life sciences)  . Smokeless tobacco: Not on file  . Alcohol Use: No    Review of Systems  Constitutional: Negative for fever and chills.  Eyes: Negative for visual disturbance.  Gastrointestinal: Negative for nausea and vomiting.  Musculoskeletal: Negative for neck pain and neck stiffness.  Skin: Negative for rash.  Neurological: Positive for headaches. Negative for dizziness, syncope and light-headedness.  All other systems reviewed and are negative.      Allergies  Review of patient's allergies indicates no known allergies.  Home Medications   Current  Outpatient Rx  Name  Route  Sig  Dispense  Refill  . ALPRAZolam (XANAX) 0.25 MG tablet   Oral   Take 0.25 mg by mouth 2 (two) times daily as needed for anxiety.         Marland Kitchen amLODipine (NORVASC) 10 MG tablet   Oral   Take 10 mg by mouth daily.          . butalbital-acetaminophen-caffeine (FIORICET WITH CODEINE) 50-325-40-30 MG per capsule   Oral   Take 1 capsule by mouth every 4 (four) hours as needed for headache.         . cloNIDine (CATAPRES) 0.2 MG tablet   Oral   Take 0.2 mg by mouth 3 (three) times daily.          . cyclobenzaprine (FLEXERIL) 10 MG tablet   Oral   Take 1 tablet (10 mg total) by mouth 2 (two) times daily as needed for muscle spasms.   20 tablet   0   . gabapentin (NEURONTIN) 300 MG capsule   Oral   Take 300 mg by mouth 3 (three) times daily.         . hydrochlorothiazide (HYDRODIURIL) 25 MG tablet   Oral   Take 25 mg by mouth daily.         Marland Kitchen lisinopril (PRINIVIL,ZESTRIL) 20 MG tablet   Oral   Take 20 mg by mouth daily.         . metFORMIN (GLUCOPHAGE) 1000 MG tablet   Oral   Take 500 mg by mouth  2 (two) times daily with a meal.          . Oxycodone HCl 10 MG TABS   Oral   Take 10 mg by mouth 3 (three) times daily as needed (for pain).         Marland Kitchen ALPRAZolam (XANAX) 0.25 MG tablet   Oral   Take 1 tablet (0.25 mg total) by mouth 2 (two) times daily as needed for anxiety.   20 tablet   0    BP 143/75  Pulse 63  Temp(Src) 97.8 F (36.6 C) (Oral)  Resp 20  SpO2 99% Physical Exam  Nursing note and vitals reviewed. Constitutional: He is oriented to person, place, and time. He appears well-developed and well-nourished. He appears distressed.  HENT:  Head: Normocephalic and atraumatic.  Eyes: Conjunctivae and EOM are normal. Pupils are equal, round, and reactive to light. No scleral icterus.  Neck: Normal range of motion. Neck supple.  Cardiovascular: Normal rate and regular rhythm.   Pulmonary/Chest: Effort normal.   Abdominal: He exhibits no distension. There is no tenderness.  Neurological: He is alert and oriented to person, place, and time. No cranial nerve deficit. He exhibits normal muscle tone. Coordination normal.  Skin: Skin is warm and dry. No rash noted. He is not diaphoretic.  Psychiatric: He has a normal mood and affect.    ED Course  Procedures (including critical care time) Labs Review Labs Reviewed - No data to display Imaging Review No results found.  EKG Interpretation   None       MDM   Final diagnoses:  Chronic headache    Pt with chronic h/o HA's, no change other than ran out of xanax.  He tells me that he was prescribed the xanax for muscle spasms and feels these are muscle spasms on the side of his head.  Exam is non focal, no sitff neck, no fever.  Will give valium here and refill Rx for xanax and he should follow up with PCP.      Saddie Benders. Dorna Mai, MD 11/20/13 [phone]

## 2013-11-20 NOTE — Discharge Instructions (Signed)
Headache and Arthritis Headaches and arthritis are common problems. This causes an interest in the possible role of arthritis in causing headaches. Several major forms of arthritis exist. Two of the most common types are:  Rheumatoid arthritis.  Osteoarthritis. Rheumatoid arthritis may begin at any age. It is a condition in which the body attacks some of its own tissues, thinking they do not belong. This leads to destruction of the bony areas around the joints. This condition may afflict any of the body's joints. It usually produces a deformity of the joint. The hands and fingers no longer appear straight but often appear angled towards one side. In some cases, the spine may be involved. Most often it is the vertebrae of the neck (cervicalspine). The areas of the neck most commonly afflicted by rheumatoid arthritis are the first and second cervical vertebrae. Curiously, rheumatoid arthritis, though it often produces severe deformities, is not always painful.  The more common form of arthritis is osteoarthritis. It is a wear-and-tear form of arthritis. It usually does not produce deformity of the joints or destruction of the bony tissues. Rather the ligaments weaken. They may be calcified due to the body's attempt to heal the damage. The larger joints of the body and those joints that take the most stress and strain are the most often affected. In the neck region this osteoarthritis usually involves the fifth, sixth and seventh vertebrae. This is because the effects of posture produce the most fatigue on them. Osteoarthritis is often more painful than rheumatoid arthritis.  During workups for arthritis, a test evaluating inflammation, (the sedimentation rate) often is performed. In rheumatoid arthritis, this test will usually be elevated. Other tests for inflammation may also be elevated. In patients with osteoarthritis, x-rays of the neck or jaw joints will show changes from "lipping" of the vertebrae. This  is caused by calcium deposits in the ligaments. Or they may show narrowing of the space between the vertebrae, or spur formation (from calcium deposits). If severe, it may cause obstruction of the holes where the nerves pass from the spine to the body. In rheumatoid arthritis, dislocation of vertebrae may occur in the upper neck. CT scan and MRI in patients with osteoarthritis may show bulging of the discs that cushion the vertebrae. In the most severe cases, herniation of the discs may occur.  Headaches, felt as a pain in the neck, may be caused by arthritis if the first, second or third vertebrae are involved. This condition is due to the nerves that supply the scalp only originating from this area of the spine. Neck pain itself, whether alone or coupled with headaches, can involve any portion of the neck. If the jaw is involved, the symptoms are similar to those of Temporomandibular Joint Syndrome (TMJ).  The progressive severity of rheumatoid arthritis may be slowed by a variety of potent medications. In osteoarthritis, its progression is not usually hindered by medication. The following may be helpful in slowing the advancement of the disorder:  Lifestyle adjustment.  Exercise.  Rest.  Weight loss. Medications, such as the nonsteroidal anti-inflammatory agents (NSAIDs), are useful. They may reduce the pain and improve the reduced motion which occurs in joints afflicted by arthritis. From some studies, the use of acetaminophen appears to be as effective in controlling the pain of arthritis as the NSAIDs. Physical modalities may also be useful for arthritis. They include:  Heat.  Massage.  Exercise. But physical therapy must be prescribed by a caregiver, just as most medications for  arthritis.  Document Released: 12/09/2003 Document Revised: 12/11/2011 Document Reviewed: 05/07/2008 [facility] Patient Information 2014 [place], [place].    Narcotic and benzodiazepine use may cause drowsiness,  slowed breathing or dependence.  Please use with caution and do not drive, operate machinery or watch young children alone while taking them.  Taking combinations of these medications or drinking alcohol will potentiate these effects.
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
## 2013-12-15 ENCOUNTER — Encounter: Payer: Self-pay | Admitting: Neurology

## 2013-12-15 ENCOUNTER — Encounter (INDEPENDENT_AMBULATORY_CARE_PROVIDER_SITE_OTHER): Payer: Self-pay

## 2013-12-15 ENCOUNTER — Ambulatory Visit: Payer: BC Managed Care – PPO | Admitting: Neurology

## 2013-12-15 ENCOUNTER — Ambulatory Visit (INDEPENDENT_AMBULATORY_CARE_PROVIDER_SITE_OTHER): Payer: BC Managed Care – PPO | Admitting: Neurology

## 2013-12-15 VITALS — BP 136/80 | HR 68 | Temp 98.8°F | Ht 69.0 in | Wt 154.0 lb

## 2013-12-15 DIAGNOSIS — R519 Headache, unspecified: Secondary | ICD-10-CM

## 2013-12-15 DIAGNOSIS — R51 Headache: Secondary | ICD-10-CM

## 2013-12-15 NOTE — Patient Instructions (Signed)
Please remember, common headache triggers are: sleep deprivation, dehydration, overheating, stress, hypoglycemia or skipping meals and blood sugar fluctuations, excessive pain medications or excessive alcohol use or caffeine withdrawal. Some people have food triggers such as aged cheese, orange juice or chocolate, especially dark chocolate, or MSG (monosodium glutamate). Try to avoid these headache triggers as much possible. It may be helpful to keep a headache diary to figure out what makes your headaches worse or brings them on and what alleviates them. Some people report headache onset after exercise but studies have shown that regular exercise may actually prevent headaches from coming. If you have exercise-induced headaches, please make sure that you drink plenty of fluid before and after exercising and that you do not over do it and do not overheat.  Since you are doing better, I can see you back as needed.
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
## 2014-04-13 ENCOUNTER — Encounter (HOSPITAL_COMMUNITY): Payer: Self-pay | Admitting: Emergency Medicine

## 2014-04-13 ENCOUNTER — Emergency Department (HOSPITAL_COMMUNITY)
Admission: EM | Admit: 2014-04-13 | Discharge: 2014-04-13 | Disposition: A | Discharge status: Home / Self Care | Payer: BC Managed Care – PPO | Attending: Emergency Medicine | Admitting: Emergency Medicine

## 2014-04-13 ENCOUNTER — Emergency Department (HOSPITAL_COMMUNITY): Payer: BC Managed Care – PPO

## 2014-04-13 DIAGNOSIS — Z87891 Personal history of nicotine dependence: Secondary | ICD-10-CM | POA: Insufficient documentation

## 2014-04-13 DIAGNOSIS — M25579 Pain in unspecified ankle and joints of unspecified foot: Secondary | ICD-10-CM | POA: Insufficient documentation

## 2014-04-13 DIAGNOSIS — M25473 Effusion, unspecified ankle: Secondary | ICD-10-CM | POA: Insufficient documentation

## 2014-04-13 DIAGNOSIS — IMO0002 Reserved for concepts with insufficient information to code with codable children: Secondary | ICD-10-CM | POA: Insufficient documentation

## 2014-04-13 DIAGNOSIS — Z79899 Other long term (current) drug therapy: Secondary | ICD-10-CM | POA: Insufficient documentation

## 2014-04-13 DIAGNOSIS — E119 Type 2 diabetes mellitus without complications: Secondary | ICD-10-CM | POA: Insufficient documentation

## 2014-04-13 DIAGNOSIS — I10 Essential (primary) hypertension: Secondary | ICD-10-CM | POA: Insufficient documentation

## 2014-04-13 DIAGNOSIS — M79672 Pain in left foot: Secondary | ICD-10-CM

## 2014-04-13 DIAGNOSIS — M25476 Effusion, unspecified foot: Secondary | ICD-10-CM | POA: Insufficient documentation

## 2014-04-13 NOTE — ED Notes (Signed)
Pt states left foot swollen since last Thursday; painful to ambulate; states doctor sent him to the ER for an xray; no known injury

## 2014-04-13 NOTE — ED Provider Notes (Signed)
CSN: [id number]     Arrival date & time 04/13/14  2706 History   First MD Initiated Contact with Patient 04/13/14 1000     Chief Complaint  Patient presents with  . Foot Pain     (Consider location/radiation/quality/duration/timing/severity/associated sxs/prior Treatment) HPI Comments: Patient presents today with a chief complaint of pain and swelling to the top of his left foot for the past 4-5 days.  He denies any injury to the foot.  Denies any erythema or warmth of the foot.  He reports that his foot also feels stiff.  He states that he does a lot of walking and that walking makes the pain and swelling worse.  He has tried soaking his foot in Alcohol and Epson salts without relief.  He has also applied Bengay to the foot without improvement.  He denies fever, chills, numbness, or tingling.  Denies any history of Gout.    The history is provided by the patient.    Past Medical History  Diagnosis Date  . Hypertension   . Diabetes mellitus without complication    History reviewed. No pertinent past surgical history. History reviewed. No pertinent family history. History  Substance Use Topics  . Smoking status: Former Research scientist (life sciences)  . Smokeless tobacco: Not on file  . Alcohol Use: No    Review of Systems  Constitutional: Negative for fever.  Musculoskeletal: Positive for arthralgias and joint swelling.  Neurological: Negative for numbness.      Allergies  Review of patient's allergies indicates no known allergies.  Home Medications   Prior to Admission medications   Medication Sig Start Date End Date Taking? Authorizing Provider  amLODipine (NORVASC) 10 MG tablet Take 10 mg by mouth every morning.    Yes Historical Provider, MD  atenolol (TENORMIN) 100 MG tablet Take 1 tablet by mouth every morning.  11/17/13  Yes Historical Provider, MD  butalbital-acetaminophen-caffeine (FIORICET, ESGIC) 50-325-40 MG per tablet Take 1 tablet by mouth 2 (two) times daily as needed for headache.    Yes Historical Provider, MD  cloNIDine (CATAPRES) 0.2 MG tablet Take 0.2 mg by mouth 3 (three) times daily.    Yes Historical Provider, MD  cyclobenzaprine (FLEXERIL) 10 MG tablet Take 10 mg by mouth 3 (three) times daily as needed for muscle spasms.   Yes Historical Provider, MD  gabapentin (NEURONTIN) 300 MG capsule Take 300 mg by mouth 3 (three) times daily.   Yes Historical Provider, MD  hydrochlorothiazide (HYDRODIURIL) 25 MG tablet Take 25 mg by mouth every morning.    Yes Historical Provider, MD  lisinopril (PRINIVIL,ZESTRIL) 20 MG tablet Take 20 mg by mouth every morning.    Yes Historical Provider, MD  lovastatin (MEVACOR) 10 MG tablet Take 10 mg by mouth every morning.   Yes Historical Provider, MD  metFORMIN (GLUCOPHAGE) 1000 MG tablet Take 500 mg by mouth 2 (two) times daily with a meal.    Yes Historical Provider, MD  predniSONE (DELTASONE) 10 MG tablet Take 10-60 mg by mouth daily with breakfast. Day 1 take 6 tabs. Day 2 take 5 tabs. Day 3 take 4 tabs. Day 4 take 3 tabs. Day 5 take 2 tabs. Day 6 take 1 tab. 04/08/14 04/13/14 Yes Historical Provider, MD   BP 142/84  Pulse 60  Temp(Src) 97.8 F (36.6 C) (Oral)  Resp 18  SpO2 98% Physical Exam  Nursing note and vitals reviewed. Constitutional: He appears well-developed and well-nourished.  HENT:  Head: Normocephalic and atraumatic.  Cardiovascular: Normal rate, regular rhythm  and normal heart sounds.   Pulses:      Dorsalis pedis pulses are 2+ on the right side, and 2+ on the left side.  Pulmonary/Chest: Effort normal and breath sounds normal.  Musculoskeletal:  Tenderness to palpation of the 3rd and 4th metatarsals of the left foot.  No overlying edema, erythema, or warmth.    Neurological: He is alert.  Distal sensation of the left toes intact.    Skin: Skin is warm and dry. No erythema.  Psychiatric: He has a normal mood and affect.    ED Course  Procedures (including critical care time) Labs Review Labs Reviewed - No  data to display  Imaging Review Dg Foot Complete Left  04/13/2014   CLINICAL DATA:  Pain and swelling across the metatarsals. No known injury.  EXAM: LEFT FOOT - COMPLETE 3+ VIEW  COMPARISON:  None.  FINDINGS: There is no fracture or dislocation. There are slight degenerative changes of the first metatarsophalangeal joint and at the IP joint of the great toe.  The patient has a partially fused os naviculare.  There is a prominent plantar calcaneal spur.  IMPRESSION: Slight degenerative changes.  No acute osseous abnormality.   Electronically Signed   By: Rozetta Nunnery M.D.   On: 04/13/2014 09:31     EKG Interpretation None      MDM   Final diagnoses:  None   Patient presenting with left foot pain for the past 4-5 days.  No known injury.  No signs of infection.  Xray showing slight degenerative changes, but nothing acute.  Patient is stable for discharge.  Instructed to follow up with PCP.  Return precautions given.    Hyman Bible, PA-C 04/15/14 2322

## 2014-04-13 NOTE — ED Notes (Addendum)
Pt states swelling and pain on top and bottom of left foot.  Started on Thursday.  Pt does not recall injury.  Pain making ambulation difficult.  No redness.  Pt states sent by MD for xray.

## 2014-04-16 NOTE — ED Provider Notes (Signed)
Medical screening examination/treatment/procedure(s) were performed by non-physician practitioner and as supervising physician I was immediately available for consultation/collaboration.   EKG Interpretation None        Ephraim Hamburger, MD 04/16/14 1720

## 2014-08-08 ENCOUNTER — Emergency Department (HOSPITAL_COMMUNITY)
Admission: EM | Admit: 2014-08-08 | Discharge: 2014-08-08 | Disposition: A | Discharge status: Home / Self Care | Payer: BC Managed Care – PPO | Attending: Emergency Medicine | Admitting: Emergency Medicine

## 2014-08-08 ENCOUNTER — Encounter (HOSPITAL_COMMUNITY): Payer: Self-pay | Admitting: Emergency Medicine

## 2014-08-08 DIAGNOSIS — Z87891 Personal history of nicotine dependence: Secondary | ICD-10-CM | POA: Diagnosis not present

## 2014-08-08 DIAGNOSIS — Z79899 Other long term (current) drug therapy: Secondary | ICD-10-CM | POA: Insufficient documentation

## 2014-08-08 DIAGNOSIS — I10 Essential (primary) hypertension: Secondary | ICD-10-CM | POA: Diagnosis not present

## 2014-08-08 DIAGNOSIS — R21 Rash and other nonspecific skin eruption: Secondary | ICD-10-CM | POA: Diagnosis present

## 2014-08-08 DIAGNOSIS — Z792 Long term (current) use of antibiotics: Secondary | ICD-10-CM | POA: Insufficient documentation

## 2014-08-08 DIAGNOSIS — E119 Type 2 diabetes mellitus without complications: Secondary | ICD-10-CM | POA: Insufficient documentation

## 2014-08-08 MED ORDER — TRIAMCINOLONE ACETONIDE 0.1 % EX CREA: 1.0000 "application " | TOPICAL_CREAM | Freq: Two times a day (BID) | CUTANEOUS | Status: AC

## 2014-08-08 MED ORDER — DIPHENHYDRAMINE HCL 25 MG PO TABS: 25.0000 mg | ORAL_TABLET | Freq: Four times a day (QID) | ORAL | Status: AC

## 2014-08-08 NOTE — ED Notes (Signed)
Pt. reports skin rashes at torso and back for several days with mild itching . Denies fever / respirations unlabored .

## 2014-08-08 NOTE — ED Provider Notes (Signed)
CSN: [id number]     Arrival date & time 08/08/14  0540 History   First MD Initiated Contact with Patient 08/08/14 [phone]     Chief Complaint  Patient presents with  . Rash      Patient is a 53 y.o. male presenting with rash.  Rash Location: chest/abdomen/back. Quality: dryness and itchiness   Quality: not painful   Severity:  Mild Onset quality:  Gradual Duration:  1 week Timing:  Constant Progression:  Worsening Chronicity:  New Relieved by:  Nothing Worsened by:  Nothing tried Associated symptoms: no fever, no shortness of breath, no sore throat, no throat swelling and no tongue swelling   Patient reports he has had a rash to his chest/back/abdomen for past 2 weeks He is unsure of cause He reports his physician put him on a "capsule" for this rash but no improvement noted    Past Medical History  Diagnosis Date  . Hypertension   . Diabetes mellitus without complication    History reviewed. No pertinent past surgical history. No family history on file. History  Substance Use Topics  . Smoking status: Former Research scientist (life sciences)  . Smokeless tobacco: Not on file  . Alcohol Use: No    Review of Systems  Constitutional: Negative for fever.  HENT: Negative for sore throat.   Respiratory: Negative for shortness of breath.   Skin: Positive for rash.      Allergies  Review of patient's allergies indicates no known allergies.  Home Medications   Prior to Admission medications   Medication Sig Start Date End Date Taking? Authorizing Provider  ALPRAZolam (XANAX) 0.25 MG tablet Take 0.25 mg by mouth 3 (three) times daily as needed for anxiety.  08/03/14  Yes Historical Provider, MD  amLODipine (NORVASC) 10 MG tablet Take 10 mg by mouth every morning.    Yes Historical Provider, MD  atenolol (TENORMIN) 100 MG tablet Take 1 tablet by mouth every morning.  11/17/13  Yes Historical Provider, MD  clindamycin (CLEOCIN) 150 MG capsule Take 150 mg by mouth 2 (two) times daily.  08/03/14  Yes  Historical Provider, MD  cloNIDine (CATAPRES) 0.2 MG tablet Take 0.2 mg by mouth 3 (three) times daily.    Yes Historical Provider, MD  doxazosin (CARDURA) 2 MG tablet Take 2 mg by mouth daily.  06/29/14  Yes Historical Provider, MD  gabapentin (NEURONTIN) 300 MG capsule Take 300 mg by mouth 3 (three) times daily.   Yes Historical Provider, MD  hydrochlorothiazide (HYDRODIURIL) 25 MG tablet Take 25 mg by mouth every morning.    Yes Historical Provider, MD  lisinopril (PRINIVIL,ZESTRIL) 20 MG tablet Take 20 mg by mouth every morning.    Yes Historical Provider, MD  lovastatin (MEVACOR) 10 MG tablet Take 10 mg by mouth every morning.   Yes Historical Provider, MD  metFORMIN (GLUCOPHAGE) 1000 MG tablet Take 500 mg by mouth 2 (two) times daily with a meal.    Yes Historical Provider, MD  OxyCODONE (OXYCONTIN) 10 mg T12A 12 hr tablet Take 10 mg by mouth every 12 (twelve) hours.   Yes Historical Provider, MD  butalbital-acetaminophen-caffeine (FIORICET, ESGIC) 50-325-40 MG per tablet Take 1 tablet by mouth 2 (two) times daily as needed for headache.    Historical Provider, MD  cyclobenzaprine (FLEXERIL) 10 MG tablet Take 10 mg by mouth 3 (three) times daily as needed for muscle spasms.    Historical Provider, MD  Oxycodone HCl 10 MG TABS  08/03/14   Historical Provider, MD  BP 138/79 mmHg  Pulse 61  Temp(Src) 97.9 F (36.6 C) (Oral)  Resp 14  Ht 5\' 11"  (1.803 m)  Wt 161 lb (73.029 kg)  BMI 22.46 kg/m2  SpO2 96% Physical Exam CONSTITUTIONAL: Well developed/well nourished HEAD: Normocephalic/atraumatic EYES: EOMI/PERRL, no conjunctival erythema noted ENMT: Mucous membranes moist, no lesions/sores noted in his mouth NECK: supple no meningeal signs CV: S1/S2 noted, no murmurs/rubs/gallops noted LUNGS: Lungs are clear to auscultation bilaterally, no apparent distress ABDOMEN: soft, nontender, no rebound or guarding NEURO: Pt is awake/alert, moves all extremitiesx4 EXTREMITIES: pulses normal, full  ROM SKIN: warm, color normal.  Diffuse papular rash to chest/back.  The skin appears dry.  There is no erythema/drainage/crepitus.  Spares palms of hands.  PSYCH: no abnormalities of mood noted  ED Course  Procedures    Possible pityriasis rosea Will give triamcinolone Will order RPR/HIV as well Referred to Dermatology  MDM   Final diagnoses:  Rash    Nursing notes including past medical history and social history reviewed and considered in documentation     Sharyon Cable, MD 08/08/14 [phone]

## 2014-08-10 LAB — RPR

## 2014-08-10 LAB — HIV ANTIBODY (ROUTINE TESTING W REFLEX): HIV: NONREACTIVE

## 2016-07-20 ENCOUNTER — Encounter: Payer: Self-pay | Admitting: Internal Medicine

## 2016-09-05 ENCOUNTER — Telehealth: Payer: Self-pay | Admitting: *Deleted

## 2016-09-05 NOTE — Telephone Encounter (Signed)
Patient no showed for 1:30pm PV appointment for colonoscopy on 09/19/2016.  Patient stated that he forgot about PV appointment.  I tried to reschedule PV appointment to keep 09/19/16 colon but patient did not want to reschedule appointment.  Patient stated that he needed his insurance to be verified and then he would reschedule  PV appointment.  I called the 3rd floor GI and asked them to check his insurance.  They said they would verify his insurance when he checked in for his PV.  They also stated there would not be a charge for PV if his insurance was not active.  I informed patient of this information, but patient was agitated and did not want to reschedule PV or come in for PV until insurance was verified active.  I informed patient that he could call the insurance company himself.  Patient then stated he talked with insurance company yesterday and was told that his insurance would be reinstated approximately 10-12 days.  I attempted several times to reschedule PV appointment but patient was non-receptive.  I talked with Cyril Mourning for guidance about this call and she advised me to cancel PV and Colon procedure and to send patient a no show letter.

## 2016-09-08 ENCOUNTER — Ambulatory Visit (AMBULATORY_SURGERY_CENTER): Payer: Self-pay

## 2016-09-08 VITALS — Ht 70.0 in | Wt 160.4 lb

## 2016-09-08 DIAGNOSIS — Z1211 Encounter for screening for malignant neoplasm of colon: Secondary | ICD-10-CM

## 2016-09-08 MED ORDER — SUPREP BOWEL PREP KIT 17.5-3.13-1.6 GM/177ML PO SOLN: 1.0000 | Freq: Once | ORAL | 0 refills | Status: AC

## 2016-09-08 NOTE — Progress Notes (Signed)
No allergies to eggs or soy No past problems with anesthesia No diet meds No home oxygen  Registered emmi 

## 2016-09-18 ENCOUNTER — Telehealth: Payer: Self-pay | Admitting: Internal Medicine

## 2016-09-18 NOTE — Telephone Encounter (Signed)
Pt states he did not get his prep at his house- informed pt he has to pick this up at his pharmacy- he said he did not know that . He will go now to get prep   Pt asked many questions concerning his instructions for today - we discussed clear liquids only and what those are, he asked about eating oatmeal and instructed no solid foods today ALL DAY, just liquids. WE discussed prep instructions for today and tomorrow, he may have clear liquids until 830 tomorrow 12-19 then nothing at all and no solids Tuesday until after the colon.   He asked if he could take a taxi home. Instructed he has to have a care partner that stays in the [facility] the whole time he's here. He asked if that person could leave, informed no they have to be in the facility the whole 2-3 hour she is here as well . He verbalized understanding of all above. Encourage to call with further questions   Marijean Niemann

## 2016-09-19 ENCOUNTER — Telehealth: Payer: Self-pay | Admitting: Internal Medicine

## 2016-09-19 ENCOUNTER — Encounter: Payer: BLUE CROSS/BLUE SHIELD | Admitting: Internal Medicine

## 2016-09-19 NOTE — Telephone Encounter (Signed)
Not if he reschedules, otherwise yes

## 2016-10-02 HISTORY — PX: COLONOSCOPY: SHX174

## 2016-10-06 ENCOUNTER — Encounter: Payer: Self-pay | Admitting: Internal Medicine

## 2016-10-06 ENCOUNTER — Ambulatory Visit (AMBULATORY_SURGERY_CENTER): Payer: BLUE CROSS/BLUE SHIELD | Admitting: Internal Medicine

## 2016-10-06 VITALS — BP 135/84 | HR 57 | Temp 97.3°F | Resp 15 | Ht 70.0 in | Wt 160.0 lb

## 2016-10-06 DIAGNOSIS — D123 Benign neoplasm of transverse colon: Secondary | ICD-10-CM

## 2016-10-06 DIAGNOSIS — D122 Benign neoplasm of ascending colon: Secondary | ICD-10-CM | POA: Diagnosis not present

## 2016-10-06 DIAGNOSIS — Z1212 Encounter for screening for malignant neoplasm of rectum: Secondary | ICD-10-CM | POA: Diagnosis not present

## 2016-10-06 DIAGNOSIS — Z1211 Encounter for screening for malignant neoplasm of colon: Secondary | ICD-10-CM | POA: Diagnosis present

## 2016-10-06 DIAGNOSIS — K635 Polyp of colon: Secondary | ICD-10-CM | POA: Diagnosis not present

## 2016-10-06 LAB — GLUCOSE, CAPILLARY
GLUCOSE-CAPILLARY: 82 mg/dL (ref 65–99)
Glucose-Capillary: 94 mg/dL (ref 65–99)

## 2016-10-06 MED ORDER — SODIUM CHLORIDE 0.9 % IV SOLN: 500.0000 mL | INTRAVENOUS | Status: DC

## 2016-10-06 NOTE — Progress Notes (Signed)
To recovery, report to Scott, RN, VSS 

## 2016-10-06 NOTE — Op Note (Signed)
[facility] Patient Name: [name] [name] Procedure Date: 10/06/2016 3:34 PM MRN: [id number] Endoscopist: Jerene Bears , MD Age: 56 Referring MD:  Date of Birth: 06/21/1961 Gender: Male Account #: [id number] Procedure:                Colonoscopy Indications:              Screening for colorectal malignant neoplasm, This                            is the patient's first colonoscopy Medicines:                Monitored Anesthesia Care Procedure:                Pre-Anesthesia Assessment:                           - Prior to the procedure, a History and Physical                            was performed, and patient medications and                            allergies were reviewed. The patient's tolerance of                            previous anesthesia was also reviewed. The risks                            and benefits of the procedure and the sedation                            options and risks were discussed with the patient.                            All questions were answered, and informed consent                            was obtained. Prior Anticoagulants: The patient has                            taken no previous anticoagulant or antiplatelet                            agents. ASA Grade Assessment: II - A patient with                            mild systemic disease. After reviewing the risks                            and benefits, the patient was deemed in                            satisfactory condition to undergo the procedure.  After obtaining informed consent, the colonoscope                            was passed under direct vision. Throughout the                            procedure, the patient's blood pressure, pulse, and                            oxygen saturations were monitored continuously. The                            Model CF-HQ190L [id number]) scope was introduced                            through the anus and advanced  to the the terminal                            ileum. The colonoscopy was performed without                            difficulty. The patient tolerated the procedure                            well. The quality of the bowel preparation was                            good. The terminal ileum, ileocecal valve,                            appendiceal orifice, and rectum were photographed. Scope In: 3:47:54 PM Scope Out: 4:13:24 PM Scope Withdrawal Time: 0 hours 22 minutes 12 seconds  Total Procedure Duration: 0 hours 25 minutes 30 seconds  Findings:                 The perianal exam findings include perianal skin                            atrophy and skin tags.                           The terminal ileum appeared normal.                           A 20 mm polyp was found in the ascending colon. The                            polyp was pedunculated. The polyp was removed with                            a hot snare. Resection and retrieval were complete.                           A 3 mm polyp was found in the transverse  colon. The                            polyp was sessile. The polyp was removed with a                            cold biopsy forceps. Resection and retrieval were                            complete.                           A 18 mm polyp was found in the splenic flexure. The                            polyp was sessile. The polyp was removed with a hot                            snare. Resection and retrieval were complete.                           Internal hemorrhoids were found during                            retroflexion. The hemorrhoids were small. Complications:            No immediate complications. Estimated Blood Loss:     Estimated blood loss: none. Impression:               - Perianal skin atrophy and skin tags found on                            perianal exam.                           - The examined portion of the ileum was normal.                            - One 20 mm polyp in the ascending colon, removed                            with a hot snare. Resected and retrieved.                           - One 3 mm polyp in the transverse colon, removed                            with a cold biopsy forceps. Resected and retrieved.                           - One 18 mm polyp at the splenic flexure, removed                            with a hot snare. Resected and retrieved.                           -  Internal hemorrhoids. Recommendation:           - Patient has a contact number available for                            emergencies. The signs and symptoms of potential                            delayed complications were discussed with the                            patient. Return to normal activities tomorrow.                            Written discharge instructions were provided to the                            patient.                           - Resume previous diet.                           - Continue present medications.                           - No aspirin, ibuprofen, naproxen, or other                            non-steroidal anti-inflammatory drugs for 3 weeks                            after polyp removal.                           - Await pathology results.                           - Repeat colonoscopy is recommended for                            surveillance of polyps greater than 1 cm in size.                            The colonoscopy date will be determined after                            pathology results from today's exam become                            available for review. Jerene Bears, MD 10/06/2016 4:17:45 PM This report has been signed electronically.

## 2016-10-06 NOTE — Progress Notes (Signed)
When time to discharge pt. Staff learned that his care partner could not drive.  He insisted that he could drive Home.  He was advised by staff that he must have a driver due to anesthesia he received this afternoon. He had to go to his car to get his phone.  Taken to car In wheelchair by Alphonsa Gin RN.  Pt. States he is Awaiting a ride from friend.

## 2016-10-06 NOTE — Patient Instructions (Addendum)
YOU HAD AN ENDOSCOPIC PROCEDURE TODAY AT [facility]:   Refer to the procedure report that was given to you for any specific questions about what was found during the examination.  If the procedure report does not answer your questions, please call your gastroenterologist to clarify.  If you requested that your care partner not be given the details of your procedure findings, then the procedure report has been included in a sealed envelope for you to review at your convenience later.  YOU SHOULD EXPECT: Some feelings of bloating in the abdomen. Passage of more gas than usual.  Walking can help get rid of the air that was put into your GI tract during the procedure and reduce the bloating. If you had a lower endoscopy (such as a colonoscopy or flexible sigmoidoscopy) you may notice spotting of blood in your stool or on the toilet paper. If you underwent a bowel prep for your procedure, you may not have a normal bowel movement for a few days.  Please Note:  You might notice some irritation and congestion in your nose or some drainage.  This is from the oxygen used during your procedure.  There is no need for concern and it should clear up in a day or so.  SYMPTOMS TO REPORT IMMEDIATELY:   Following lower endoscopy (colonoscopy or flexible sigmoidoscopy):  Excessive amounts of blood in the stool  Significant tenderness or worsening of abdominal pains  Swelling of the abdomen that is new, acute  Fever of 100F or higher   Following upper endoscopy (EGD)  Vomiting of blood or coffee ground material  New chest pain or pain under the shoulder blades  Painful or persistently difficult swallowing  New shortness of breath  Fever of 100F or higher  Black, tarry-looking stools  For urgent or emergent issues, a gastroenterologist can be reached at any hour by calling [phone].   DIET:  We do recommend a small meal at first, but then you may proceed to your regular diet.  Drink  plenty of fluids but you should avoid alcoholic beverages for 24 hours.  ACTIVITY:  You should plan to take it easy for the rest of today and you should NOT DRIVE or use heavy machinery until tomorrow (because of the sedation medicines used during the test).    FOLLOW UP: Our staff will call the number listed on your records the next business day following your procedure to check on you and address any questions or concerns that you may have regarding the information given to you following your procedure. If we do not reach you, we will leave a message.  However, if you are feeling well and you are not experiencing any problems, there is no need to return our call.  We will assume that you have returned to your regular daily activities without incident.  If any biopsies were taken you will be contacted by phone or by letter within the next 1-3 weeks.  Please call us at [phone] if you have not heard about the biopsies in 3 weeks.    SIGNATURES/CONFIDENTIALITY: You and/or your care partner have signed paperwork which will be entered into your electronic medical record.  These signatures attest to the fact that that the information above on your After Visit Summary has been reviewed and is understood.  Full responsibility of the confidentiality of this discharge information lies with you and/or your care-partner.  Polyp and hemorrhoid information given.  No aspirin, ibuprofen, naproxen or  other non steroidal anti inflammory meds for 3 weeks.

## 2016-10-06 NOTE — Progress Notes (Signed)
Pt. And carepartner picked up for ride home.

## 2016-10-06 NOTE — Progress Notes (Signed)
Called to room to assist during endoscopic procedure.  Patient ID and intended procedure confirmed with present staff. Received instructions for my participation in the procedure from the performing physician.  

## 2016-10-09 ENCOUNTER — Telehealth: Payer: Self-pay | Admitting: *Deleted

## 2016-10-09 NOTE — Telephone Encounter (Signed)
  Follow up Call-  Call back number 10/06/2016  Post procedure Call Back phone  # [phone]  Permission to leave phone message Yes  Some recent data might be hidden     Patient questions:  Do you have a fever, pain , or abdominal swelling? No. Pain Score  0 *  Have you tolerated food without any problems? Yes.    Have you been able to return to your normal activities? Yes.    Do you have any questions about your discharge instructions: Diet   No. Medications  No. Follow up visit  No.  Do you have questions or concerns about your Care? No.  Actions: * If pain score is 4 or above: No action needed, pain <4.

## 2016-10-13 ENCOUNTER — Encounter: Payer: Self-pay | Admitting: Internal Medicine

## 2016-10-16 ENCOUNTER — Encounter (HOSPITAL_COMMUNITY): Payer: Self-pay | Admitting: Emergency Medicine

## 2016-10-16 ENCOUNTER — Emergency Department (HOSPITAL_COMMUNITY)
Admission: EM | Admit: 2016-10-16 | Discharge: 2016-10-16 | Disposition: A | Discharge status: Home / Self Care | Payer: BLUE CROSS/BLUE SHIELD | Attending: Emergency Medicine | Admitting: Emergency Medicine

## 2016-10-16 DIAGNOSIS — Z87891 Personal history of nicotine dependence: Secondary | ICD-10-CM | POA: Insufficient documentation

## 2016-10-16 DIAGNOSIS — E119 Type 2 diabetes mellitus without complications: Secondary | ICD-10-CM | POA: Insufficient documentation

## 2016-10-16 DIAGNOSIS — G8929 Other chronic pain: Secondary | ICD-10-CM | POA: Insufficient documentation

## 2016-10-16 DIAGNOSIS — Z79899 Other long term (current) drug therapy: Secondary | ICD-10-CM | POA: Diagnosis not present

## 2016-10-16 DIAGNOSIS — M545 Low back pain, unspecified: Secondary | ICD-10-CM

## 2016-10-16 DIAGNOSIS — Y999 Unspecified external cause status: Secondary | ICD-10-CM | POA: Diagnosis not present

## 2016-10-16 DIAGNOSIS — Y939 Activity, unspecified: Secondary | ICD-10-CM | POA: Diagnosis not present

## 2016-10-16 DIAGNOSIS — Y92410 Unspecified street and highway as the place of occurrence of the external cause: Secondary | ICD-10-CM | POA: Diagnosis not present

## 2016-10-16 DIAGNOSIS — I10 Essential (primary) hypertension: Secondary | ICD-10-CM | POA: Insufficient documentation

## 2016-10-16 DIAGNOSIS — Z7984 Long term (current) use of oral hypoglycemic drugs: Secondary | ICD-10-CM | POA: Diagnosis not present

## 2016-10-16 MED ORDER — HYDROCODONE-ACETAMINOPHEN 5-325 MG PO TABS
1.0000 | ORAL_TABLET | Freq: Once | ORAL | Status: AC
  Administered 2016-10-16: 1 via ORAL
  Filled 2016-10-16: qty 1

## 2016-10-16 NOTE — Discharge Instructions (Signed)
Continue taking your home prescription of tramadol as prescribed as needed for pain relief. He may also take 600 mg ibuprofen every 6 hours as needed for additional pain relief. I recommend trying to apply ice and/or heat to affected area for 15-20 minutes 3-4 times daily for additional pain relief. Call your primary care provider within the next week if your symptoms have not improved. Please return to the Emergency Department if symptoms worsen or new onset of fever, numbness, tingling, groin anesthesia, loss of bowel or bladder, weakness.

## 2016-10-16 NOTE — ED Notes (Signed)
Patient was alert, oriented and stable upon discharge. RN went over AVS and patient had no further questions.  
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
## 2017-01-15 ENCOUNTER — Ambulatory Visit: Payer: Self-pay | Admitting: Surgery

## 2017-01-15 NOTE — H&P (Signed)
History of Present Illness [name] [name]. Bre Pecina MD; 01/15/2017 12:40 PM) The patient is a 56 year old male who presents with abdominal pain. Referred by Dr. Lillia Corporal for abdominal pain/ acalculus cholecystitis  This is a 56 year old male with diabetes and hypertension who presents with a two-month history of intermittent postprandial abdominal pain. This is associated with significant nausea. The patient also reports some constipation requiring the use of laxatives. The pain seems relatively constant but is exacerbated by eating. He was initially evaluated in early March and underwent ultrasound. Ultrasound shows gallbladder wall thickening up to 8 mm with no evidence of gallstones. Common bile duct was within normal limits. I do not see any sign of liver function testing. He was given the diagnosis of acalculous cholecystitis and is referred for surgical evaluation. Patient also reports some back pain but he does have some degenerative disc disease.     Diagnostic Studies History (Janette Ranson, CMA; 01/15/2017 10:23 AM) Colonoscopy never  Allergies (Janette Ranson, CMA; 01/15/2017 10:24 AM) No Known Drug Allergies 01/15/2017 Allergies Reconciled  Medication History (Janette Ranson, CMA; 01/15/2017 10:27 AM) Meloxicam (15MG  Tablet, Oral) Active. HydrALAZINE HCl (50MG  Tablet, Oral) Active. Atenolol (50MG  Tablet, Oral) Active. AmLODIPine Besylate (10MG  Tablet, Oral) Active. Tylenol/Codeine #2 (300-15MG  Tablet, Oral) Active. Medications Reconciled  Social History (Janette Ranson, CMA; 01/15/2017 10:23 AM) Alcohol use Occasional alcohol use. No caffeine use No drug use Tobacco use Never smoker.  Family History (Janette Ranson, CMA; 01/15/2017 10:23 AM) Diabetes Mellitus Father, Mother, Sister. Hypertension Father, Mother, Sister.  Other Problems (Janette Ranson, CMA; 01/15/2017 10:23 AM) Diabetes Mellitus High blood pressure     Review of Systems  (Janette Ranson CMA; 01/15/2017 10:23 AM) General Present- Weight Loss. Not Present- Appetite Loss, Chills, Fatigue, Fever, Night Sweats and Weight Gain. Skin Present- Hives and Rash. Not Present- Change in Wart/Mole, Dryness, Jaundice, New Lesions, Non-Healing Wounds and Ulcer. HEENT Present- Earache. Not Present- Hearing Loss, Hoarseness, Nose Bleed, Oral Ulcers, Ringing in the Ears, Seasonal Allergies, Sinus Pain, Sore Throat, Visual Disturbances, Wears glasses/contact lenses and Yellow Eyes. Gastrointestinal Present- Difficulty Swallowing. Not Present- Abdominal Pain, Bloating, Bloody Stool, Change in Bowel Habits, Chronic diarrhea, Constipation, Excessive gas, Gets full quickly at meals, Hemorrhoids, Indigestion, Nausea, Rectal Pain and Vomiting.  Vitals (Janette Ranson CMA; 01/15/2017 10:29 AM) 01/15/2017 10:27 AM Weight: 157.6 lb Height: 71in Body Surface Area: 1.91 m Body Mass Index: 21.98 kg/m  Temp.: 98.66F  Pulse: 65 (Regular)  BP: 120/80 (Sitting, Left Arm, Standard)      Physical Exam Rodman Key K. Myran Arcia MD; 01/15/2017 12:41 PM)  The physical exam findings are as follows: Note:WDWN in NAD Eyes: Pupils equal, round; sclera anicteric HENT: Oral mucosa moist; good dentition Neck: No masses palpated, no thyromegaly Lungs: CTA bilaterally; normal respiratory effort CV: Regular rate and rhythm; no murmurs; extremities well-perfused with no edema Abd: +bowel sounds, soft, mild RUQ tenderness, no palpable organomegaly; Healed upper midline incision with small palpable umbilical hernia Skin: Warm, dry; no sign of jaundice Psychiatric - alert and oriented x 4; calm mood and affect    Assessment & Plan Rodman Key K. 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 The surgical procedure has been discussed with the patient. Potential risks,  benefits, alternative treatments, and expected outcomes have been explained. All of the patient's questions at this time have been answered. The likelihood of reaching the patient's treatment goal is good. The patient understand  the proposed surgical procedure and wishes to proceed  [name] [name]. Georgette Dover, MD, [facility]  General/ Trauma Surgery  01/15/2017 12:42 PM

## 2017-01-24 ENCOUNTER — Emergency Department (HOSPITAL_COMMUNITY): Payer: BLUE CROSS/BLUE SHIELD

## 2017-01-24 ENCOUNTER — Inpatient Hospital Stay (HOSPITAL_COMMUNITY)
Admission: EM | Admit: 2017-01-24 | Discharge: 2017-01-25 | DRG: 378 | Discharge status: Against Medical Advice | Payer: BLUE CROSS/BLUE SHIELD | Attending: Internal Medicine | Admitting: Internal Medicine

## 2017-01-24 DIAGNOSIS — K922 Gastrointestinal hemorrhage, unspecified: Secondary | ICD-10-CM

## 2017-01-24 DIAGNOSIS — D62 Acute posthemorrhagic anemia: Secondary | ICD-10-CM | POA: Diagnosis present

## 2017-01-24 DIAGNOSIS — E119 Type 2 diabetes mellitus without complications: Secondary | ICD-10-CM

## 2017-01-24 DIAGNOSIS — I10 Essential (primary) hypertension: Secondary | ICD-10-CM | POA: Diagnosis not present

## 2017-01-24 DIAGNOSIS — R42 Dizziness and giddiness: Secondary | ICD-10-CM | POA: Diagnosis present

## 2017-01-24 DIAGNOSIS — T39395A Adverse effect of other nonsteroidal anti-inflammatory drugs [NSAID], initial encounter: Secondary | ICD-10-CM | POA: Diagnosis present

## 2017-01-24 DIAGNOSIS — I959 Hypotension, unspecified: Secondary | ICD-10-CM | POA: Diagnosis present

## 2017-01-24 DIAGNOSIS — I129 Hypertensive chronic kidney disease with stage 1 through stage 4 chronic kidney disease, or unspecified chronic kidney disease: Secondary | ICD-10-CM | POA: Diagnosis present

## 2017-01-24 DIAGNOSIS — E1165 Type 2 diabetes mellitus with hyperglycemia: Secondary | ICD-10-CM | POA: Diagnosis present

## 2017-01-24 DIAGNOSIS — Z7984 Long term (current) use of oral hypoglycemic drugs: Secondary | ICD-10-CM | POA: Diagnosis not present

## 2017-01-24 DIAGNOSIS — Z791 Long term (current) use of non-steroidal anti-inflammatories (NSAID): Secondary | ICD-10-CM

## 2017-01-24 DIAGNOSIS — K921 Melena: Secondary | ICD-10-CM | POA: Diagnosis not present

## 2017-01-24 DIAGNOSIS — K3189 Other diseases of stomach and duodenum: Secondary | ICD-10-CM | POA: Diagnosis not present

## 2017-01-24 DIAGNOSIS — K264 Chronic or unspecified duodenal ulcer with hemorrhage: Secondary | ICD-10-CM | POA: Diagnosis present

## 2017-01-24 DIAGNOSIS — D649 Anemia, unspecified: Secondary | ICD-10-CM | POA: Diagnosis not present

## 2017-01-24 DIAGNOSIS — Z87891 Personal history of nicotine dependence: Secondary | ICD-10-CM | POA: Diagnosis not present

## 2017-01-24 DIAGNOSIS — E1122 Type 2 diabetes mellitus with diabetic chronic kidney disease: Secondary | ICD-10-CM | POA: Diagnosis present

## 2017-01-24 DIAGNOSIS — N179 Acute kidney failure, unspecified: Secondary | ICD-10-CM | POA: Diagnosis not present

## 2017-01-24 DIAGNOSIS — E876 Hypokalemia: Secondary | ICD-10-CM | POA: Diagnosis present

## 2017-01-24 DIAGNOSIS — E118 Type 2 diabetes mellitus with unspecified complications: Secondary | ICD-10-CM

## 2017-01-24 DIAGNOSIS — H6122 Impacted cerumen, left ear: Secondary | ICD-10-CM | POA: Diagnosis present

## 2017-01-24 DIAGNOSIS — H9202 Otalgia, left ear: Secondary | ICD-10-CM | POA: Diagnosis present

## 2017-01-24 DIAGNOSIS — Z79899 Other long term (current) drug therapy: Secondary | ICD-10-CM

## 2017-01-24 DIAGNOSIS — N189 Chronic kidney disease, unspecified: Secondary | ICD-10-CM

## 2017-01-24 DIAGNOSIS — N183 Chronic kidney disease, stage 3 (moderate): Secondary | ICD-10-CM | POA: Diagnosis not present

## 2017-01-24 DIAGNOSIS — R55 Syncope and collapse: Secondary | ICD-10-CM

## 2017-01-24 DIAGNOSIS — Z79891 Long term (current) use of opiate analgesic: Secondary | ICD-10-CM

## 2017-01-24 DIAGNOSIS — D72829 Elevated white blood cell count, unspecified: Secondary | ICD-10-CM | POA: Diagnosis not present

## 2017-01-24 HISTORY — DX: Chronic or unspecified duodenal ulcer with hemorrhage: K26.4

## 2017-01-24 HISTORY — DX: Calculus of gallbladder without cholecystitis without obstruction: K80.20

## 2017-01-24 LAB — COMPREHENSIVE METABOLIC PANEL
ALBUMIN: 2 g/dL — AB (ref 3.5–5.0)
ALT: 11 U/L — AB (ref 17–63)
AST: 20 U/L (ref 15–41)
Alkaline Phosphatase: 87 U/L (ref 38–126)
Anion gap: 8 (ref 5–15)
BUN: 50 mg/dL — AB (ref 6–20)
CHLORIDE: 104 mmol/L (ref 101–111)
CO2: 26 mmol/L (ref 22–32)
Calcium: 7.4 mg/dL — ABNORMAL LOW (ref 8.9–10.3)
Creatinine, Ser: 2.71 mg/dL — ABNORMAL HIGH (ref 0.61–1.24)
GFR calc Af Amer: 28 mL/min — ABNORMAL LOW (ref >60)
GFR calc non Af Amer: 24 mL/min — ABNORMAL LOW (ref >60)
GLUCOSE: 252 mg/dL — AB (ref 65–99)
POTASSIUM: 3.4 mmol/L — AB (ref 3.5–5.1)
Sodium: 138 mmol/L (ref 135–145)
Total Bilirubin: 0.3 mg/dL (ref 0.3–1.2)
Total Protein: 4.9 g/dL — ABNORMAL LOW (ref 6.5–8.1)

## 2017-01-24 LAB — URINALYSIS, ROUTINE W REFLEX MICROSCOPIC
Bilirubin Urine: NEGATIVE
GLUCOSE, UA: NEGATIVE mg/dL
Hgb urine dipstick: NEGATIVE
Ketones, ur: NEGATIVE mg/dL
LEUKOCYTES UA: NEGATIVE
NITRITE: NEGATIVE
PH: 5 (ref 5.0–8.0)
Protein, ur: NEGATIVE mg/dL
Specific Gravity, Urine: 1.014 (ref 1.005–1.030)

## 2017-01-24 LAB — APTT: aPTT: 24 seconds (ref 24–36)

## 2017-01-24 LAB — I-STAT TROPONIN, ED: Troponin i, poc: 0.01 ng/mL (ref 0.00–0.08)

## 2017-01-24 LAB — CBC WITH DIFFERENTIAL/PLATELET
BASOS ABS: 0 10*3/uL (ref 0.0–0.1)
BASOS PCT: 0 %
EOS PCT: 0 %
Eosinophils Absolute: 0 10*3/uL (ref 0.0–0.7)
HCT: 14.4 % — ABNORMAL LOW (ref 39.0–52.0)
Hemoglobin: 4.7 g/dL — CL (ref 13.0–17.0)
Lymphocytes Relative: 7 %
Lymphs Abs: 1.2 10*3/uL (ref 0.7–4.0)
MCH: 28.1 pg (ref 26.0–34.0)
MCHC: 32.6 g/dL (ref 30.0–36.0)
MCV: 86.2 fL (ref 78.0–100.0)
MONO ABS: 1.1 10*3/uL — AB (ref 0.1–1.0)
Monocytes Relative: 6 %
NEUTROS ABS: 15.1 10*3/uL — AB (ref 1.7–7.7)
Neutrophils Relative %: 87 %
PLATELETS: 332 10*3/uL (ref 150–400)
RBC: 1.67 MIL/uL — AB (ref 4.22–5.81)
RDW: 13.5 % (ref 11.5–15.5)
WBC: 17.4 10*3/uL — AB (ref 4.0–10.5)

## 2017-01-24 LAB — POC OCCULT BLOOD, ED: FECAL OCCULT BLD: POSITIVE — AB

## 2017-01-24 LAB — RAPID URINE DRUG SCREEN, HOSP PERFORMED
AMPHETAMINES: NOT DETECTED
BENZODIAZEPINES: NOT DETECTED
Barbiturates: NOT DETECTED
COCAINE: NOT DETECTED
OPIATES: POSITIVE — AB
Tetrahydrocannabinol: NOT DETECTED

## 2017-01-24 LAB — ETHANOL

## 2017-01-24 LAB — LIPASE, BLOOD: LIPASE: 21 U/L (ref 11–51)

## 2017-01-24 LAB — PREPARE RBC (CROSSMATCH)

## 2017-01-24 LAB — PROTIME-INR
INR: 1.28
Prothrombin Time: 16 seconds — ABNORMAL HIGH (ref 11.4–15.2)

## 2017-01-24 MED ORDER — ONDANSETRON HCL 4 MG/2ML IJ SOLN: 4.0000 mg | Freq: Four times a day (QID) | INTRAMUSCULAR | Status: DC | PRN

## 2017-01-24 MED ORDER — SODIUM CHLORIDE 0.9 % IV SOLN
10.0000 mL/h | Freq: Once | INTRAVENOUS | Status: AC
  Administered 2017-01-25: 10:00:00 via INTRAVENOUS

## 2017-01-24 MED ORDER — POTASSIUM CHLORIDE 2 MEQ/ML IV SOLN
30.0000 meq | Freq: Once | INTRAVENOUS | Status: AC
  Administered 2017-01-24: 30 meq via INTRAVENOUS
  Filled 2017-01-24: qty 15

## 2017-01-24 MED ORDER — SODIUM CHLORIDE 0.9 % IV SOLN
10.0000 mL/h | Freq: Once | INTRAVENOUS | Status: AC
  Administered 2017-01-24: 10 mL/h via INTRAVENOUS

## 2017-01-24 MED ORDER — INSULIN ASPART 100 UNIT/ML ~~LOC~~ SOLN: 0.0000 [IU] | Freq: Four times a day (QID) | SUBCUTANEOUS | Status: DC

## 2017-01-24 MED ORDER — ONDANSETRON HCL 4 MG PO TABS: 4.0000 mg | ORAL_TABLET | Freq: Four times a day (QID) | ORAL | Status: DC | PRN

## 2017-01-24 MED ORDER — ACETAMINOPHEN 325 MG PO TABS: 650.0000 mg | ORAL_TABLET | Freq: Four times a day (QID) | ORAL | Status: DC | PRN

## 2017-01-24 MED ORDER — ALBUTEROL SULFATE (2.5 MG/3ML) 0.083% IN NEBU
2.5000 mg | INHALATION_SOLUTION | RESPIRATORY_TRACT | Status: DC | PRN
Start: 2017-01-24 — End: 2017-01-25

## 2017-01-24 MED ORDER — SODIUM CHLORIDE 0.9 % IV SOLN
8.0000 mg/h | INTRAVENOUS | Status: DC
  Administered 2017-01-25 (×2): 8 mg/h via INTRAVENOUS
  Filled 2017-01-24 (×5): qty 80

## 2017-01-24 MED ORDER — ACETAMINOPHEN 650 MG RE SUPP: 650.0000 mg | Freq: Four times a day (QID) | RECTAL | Status: DC | PRN

## 2017-01-24 MED ORDER — SODIUM CHLORIDE 0.9 % IV BOLUS (SEPSIS)
1000.0000 mL | Freq: Once | INTRAVENOUS | Status: AC
  Administered 2017-01-24: 1000 mL via INTRAVENOUS

## 2017-01-24 MED ORDER — SODIUM CHLORIDE 0.9 % IV SOLN
80.0000 mg | Freq: Once | INTRAVENOUS | Status: AC
  Administered 2017-01-24: 80 mg via INTRAVENOUS
  Filled 2017-01-24: qty 80

## 2017-01-24 NOTE — ED Notes (Signed)
Chem 8 sample ran twice. Due to the low values of Hct and Hb* no values reported. RN Myriam Jacobson and Dr Lita Mains given a copy of print out.
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
## 2017-01-24 NOTE — ED Notes (Signed)
Pt's O2 sats noted to drop to 88% when pt dozes off. Pt woken up and encouraged to take deep breaths, O2 sat increases to 97%. Pt on 2L via nasal cannula

## 2017-01-24 NOTE — ED Notes (Signed)
ED Provider at bedside. 
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
## 2017-01-24 NOTE — ED Provider Notes (Signed)
[facility] DEPT Provider Note   CSN: [id number] Arrival date & time: 01/24/17  2355     History   Chief Complaint Chief Complaint  Patient presents with  . GI Bleeding    HPI [name] [name] is a 56 y.o. male.  HPI Patient is a poor historian. Was found by EMS in a parking lot of gas station in his car. He was unresponsive at the time. Incontinent of dark bloody stool. Initial blood pressure was 80/50. Improved to 132/86 in route. Patient became more alert and responsive. States he's had lightheadedness for the last few days. States he feels like he is going to pass out when he stands up. He also has had diarrhea but denies melanotic or grossly bloody stool. He has no abdominal pain. No chest pain or shortness of breath. Patient is not on any blood thinner. Past Medical History:  Diagnosis Date  . Diabetes mellitus without complication ([place])   . Hypertension     Patient Active Problem List   Diagnosis Date Noted  . GI bleed 01/24/2017  . Hypertension 01/01/2011  . Diabetes mellitus 01/01/2011    Past Surgical History:  Procedure Laterality Date  . FRACTURE SURGERY     right ankle       Home Medications    Prior to Admission medications   Medication Sig Start Date End Date Taking? Authorizing Provider  ALPRAZolam (XANAX) 0.25 MG tablet Take 0.25 mg by mouth 3 (three) times daily as needed for anxiety.  08/03/14   Historical Provider, MD  amLODipine (NORVASC) 10 MG tablet Take 10 mg by mouth every morning.     Historical Provider, MD  atenolol (TENORMIN) 100 MG tablet Take 1 tablet by mouth every morning.  11/17/13   Historical Provider, MD  butalbital-acetaminophen-caffeine (FIORICET, ESGIC) 50-325-40 MG per tablet Take 1 tablet by mouth 2 (two) times daily as needed for headache.    Historical Provider, MD  clindamycin (CLEOCIN) 150 MG capsule Take 150 mg by mouth 2 (two) times daily.  08/03/14   Historical Provider, MD  cloNIDine (CATAPRES) 0.2 MG tablet Take 0.2 mg  by mouth 3 (three) times daily.     Historical Provider, MD  cyclobenzaprine (FLEXERIL) 10 MG tablet Take 10 mg by mouth 3 (three) times daily as needed for muscle spasms.    Historical Provider, MD  diphenhydrAMINE (BENADRYL) 25 MG tablet Take 1 tablet (25 mg total) by mouth every 6 (six) hours. 08/08/14   Ripley Fraise, MD  doxazosin (CARDURA) 2 MG tablet Take 2 mg by mouth daily.  06/29/14   Historical Provider, MD  gabapentin (NEURONTIN) 300 MG capsule Take 300 mg by mouth 3 (three) times daily.    Historical Provider, MD  hydrochlorothiazide (HYDRODIURIL) 25 MG tablet Take 25 mg by mouth every morning.     Historical Provider, MD  lisinopril (PRINIVIL,ZESTRIL) 20 MG tablet Take 20 mg by mouth every morning.     Historical Provider, MD  lovastatin (MEVACOR) 10 MG tablet Take 10 mg by mouth every morning.    Historical Provider, MD  metFORMIN (GLUCOPHAGE) 1000 MG tablet Take 500 mg by mouth 2 (two) times daily with a meal.     Historical Provider, MD  OxyCODONE (OXYCONTIN) 10 mg T12A 12 hr tablet Take 10 mg by mouth every 12 (twelve) hours.    Historical Provider, MD    Family History Family History  Problem Relation Age of Onset  . Colon cancer Neg Hx     Social History Social History  Substance Use Topics  . Smoking status: Former Research scientist (life sciences)  . Smokeless tobacco: Never Used  . Alcohol use Yes     Comment: twice a month     Allergies   Patient has no known allergies.   Review of Systems Review of Systems  Constitutional: Negative for chills and fever.  Respiratory: Negative for cough and shortness of breath.   Cardiovascular: Negative for chest pain.  Gastrointestinal: Positive for blood in stool and diarrhea. Negative for abdominal pain, constipation, nausea and vomiting.  Musculoskeletal: Negative for back pain and neck pain.  Skin: Negative for rash and wound.  Neurological: Positive for dizziness, syncope and light-headedness. Negative for weakness and headaches.  All  other systems reviewed and are negative.    Physical Exam Updated Vital Signs BP 114/60   Pulse 66   Temp 98.3 F (36.8 C) (Oral)   Resp 15   SpO2 100%   Physical Exam  Constitutional: He is oriented to person, place, and time. He appears well-developed and well-nourished. No distress.  HENT:  Head: Normocephalic and atraumatic.  Mouth/Throat: Oropharynx is clear and moist. No oropharyngeal exudate.  No evidence of any head injury.  Eyes: EOM are normal. Pupils are equal, round, and reactive to light.  Neck: Normal range of motion. Neck supple.  No posterior midline cervical tenderness to palpation.  Cardiovascular: Normal rate and regular rhythm.  Exam reveals no gallop and no friction rub.   No murmur heard. Pulmonary/Chest: Effort normal and breath sounds normal.  Abdominal: Soft. Bowel sounds are normal. There is no tenderness. There is no rebound and no guarding.  Genitourinary:  Genitourinary Comments: Dark, viscous liquid stool on rectal exam.  Musculoskeletal: Normal range of motion. He exhibits no edema or tenderness.  No lower extremity swelling or asymmetry.  Neurological: He is alert and oriented to person, place, and time.  Moving all extremities without deficit. Sensation fully intact.  Skin: Skin is warm and dry. Capillary refill takes less than 2 seconds. No rash noted. No erythema.  Psychiatric: He has a normal mood and affect. His behavior is normal.  Nursing note and vitals reviewed.    ED Treatments / Results  Labs (all labs ordered are listed, but only abnormal results are displayed) Labs Reviewed  CBC WITH DIFFERENTIAL/PLATELET - Abnormal; Notable for the following:       Result Value   WBC 17.4 (*)    RBC 1.67 (*)    Hemoglobin 4.7 (*)    HCT 14.4 (*)    Neutro Abs 15.1 (*)    Monocytes Absolute 1.1 (*)    All other components within normal limits  COMPREHENSIVE METABOLIC PANEL - Abnormal; Notable for the following:    Potassium 3.4 (*)     Glucose, Bld 252 (*)    BUN 50 (*)    Creatinine, Ser 2.71 (*)    Calcium 7.4 (*)    Total Protein 4.9 (*)    Albumin 2.0 (*)    ALT 11 (*)    GFR calc non Af Amer 24 (*)    GFR calc Af Amer 28 (*)    All other components within normal limits  PROTIME-INR - Abnormal; Notable for the following:    Prothrombin Time 16.0 (*)    All other components within normal limits  POC OCCULT BLOOD, ED - Abnormal; Notable for the following:    Fecal Occult Bld POSITIVE (*)    All other components within normal limits  LIPASE, BLOOD  ETHANOL  APTT  RAPID URINE DRUG SCREEN, HOSP PERFORMED  URINALYSIS, ROUTINE W REFLEX MICROSCOPIC  I-STAT TROPOININ, ED  I-STAT CHEM 8, ED  TYPE AND SCREEN  PREPARE RBC (CROSSMATCH)  PREPARE RBC (CROSSMATCH)    EKG  EKG Interpretation  Date/Time:  Wednesday January 24 2017 19:21:24 EDT Ventricular Rate:  69 PR Interval:    QRS Duration: 90 QT Interval:  440 QTC Calculation: 472 R Axis:   177 Text Interpretation:  Sinus rhythm Abnormal R-wave progression, early transition Inferior infarct, old Abnormal lateral Q waves Confirmed by Lita Mains  MD, Mitcheal Sweetin ([id number]) on 01/24/2017 8:29:52 PM Also confirmed by Lita Mains  MD, Amron Guerrette ([id number])  on 01/24/2017 9:11:16 PM       Radiology No results found.  Procedures Procedures (including critical care time)  Medications Ordered in ED Medications  pantoprazole (PROTONIX) 80 mg in sodium chloride 0.9 % 100 mL IVPB (not administered)  pantoprazole (PROTONIX) 80 mg in sodium chloride 0.9 % 250 mL (0.32 mg/mL) infusion (not administered)  0.9 %  sodium chloride infusion (10 mL/hr Intravenous Not Given 01/24/17 2105)  sodium chloride 0.9 % bolus 1,000 mL (1,000 mLs Intravenous New Bag/Given 01/24/17 1931)  0.9 %  sodium chloride infusion (10 mL/hr Intravenous New Bag/Given 01/24/17 2104)   CRITICAL CARE Performed by: Lita Mains, Micayla Brathwaite Total critical care time: 40 minutes Critical care time was exclusive of separately  billable procedures and treating other patients. Critical care was necessary to treat or prevent imminent or life-threatening deterioration. Critical care was time spent personally by me on the following activities: development of treatment plan with patient and/or surrogate as well as nursing, discussions with consultants, evaluation of patient's response to treatment, examination of patient, obtaining history from patient or surrogate, ordering and performing treatments and interventions, ordering and review of laboratory studies, ordering and review of radiographic studies, pulse oximetry and re-evaluation of patient's condition.  Initial Impression / Assessment and Plan / ED Course  I have reviewed the triage vital signs and the nursing notes.  Pertinent labs & imaging results that were available during my care of the patient were reviewed by me and considered in my medical decision making (see chart for details).    Patient's systolic blood pressure remained over 100. 2 IVs placed. Given bolus of IV fluids and blood sent for type and screen. Hemoglobin is 4.7. We'll go ahead and transfuse packed red blood cells. Will also start on Protonix drip. Discussed with gastroenterology who will see patient. Also discussed with hospitalist, Dr. Tamala Julian. We'll see patient in emergency department and admit to step down bed.   Final Clinical Impressions(s) / ED Diagnoses   Final diagnoses:  Acute GI bleeding  AKI (acute kidney injury) ([place])  Syncope, unspecified syncope type    New Prescriptions New Prescriptions   No medications on file     Julianne Rice, MD 01/24/17 2359

## 2017-01-25 ENCOUNTER — Inpatient Hospital Stay (HOSPITAL_COMMUNITY): Payer: BLUE CROSS/BLUE SHIELD | Admitting: Certified Registered Nurse Anesthetist

## 2017-01-25 ENCOUNTER — Encounter (HOSPITAL_COMMUNITY): Payer: Self-pay | Admitting: Physician Assistant

## 2017-01-25 ENCOUNTER — Encounter (HOSPITAL_COMMUNITY)
Admission: EM | Discharge status: Against Medical Advice | Payer: Self-pay | Source: Home / Self Care | Attending: Internal Medicine

## 2017-01-25 DIAGNOSIS — D62 Acute posthemorrhagic anemia: Secondary | ICD-10-CM

## 2017-01-25 DIAGNOSIS — K264 Chronic or unspecified duodenal ulcer with hemorrhage: Principal | ICD-10-CM

## 2017-01-25 DIAGNOSIS — K921 Melena: Secondary | ICD-10-CM

## 2017-01-25 DIAGNOSIS — K3189 Other diseases of stomach and duodenum: Secondary | ICD-10-CM

## 2017-01-25 DIAGNOSIS — N179 Acute kidney failure, unspecified: Secondary | ICD-10-CM

## 2017-01-25 HISTORY — PX: ESOPHAGOGASTRODUODENOSCOPY: SHX5428

## 2017-01-25 LAB — BASIC METABOLIC PANEL
ANION GAP: 8 (ref 5–15)
BUN: 36 mg/dL — ABNORMAL HIGH (ref 6–20)
CALCIUM: 8.1 mg/dL — AB (ref 8.9–10.3)
CHLORIDE: 109 mmol/L (ref 101–111)
CO2: 27 mmol/L (ref 22–32)
Creatinine, Ser: 2.03 mg/dL — ABNORMAL HIGH (ref 0.61–1.24)
GFR calc Af Amer: 40 mL/min — ABNORMAL LOW (ref >60)
GFR calc non Af Amer: 35 mL/min — ABNORMAL LOW (ref >60)
GLUCOSE: 103 mg/dL — AB (ref 65–99)
Potassium: 3.9 mmol/L (ref 3.5–5.1)
Sodium: 144 mmol/L (ref 135–145)

## 2017-01-25 LAB — HIV ANTIBODY (ROUTINE TESTING W REFLEX): HIV Screen 4th Generation wRfx: NONREACTIVE

## 2017-01-25 LAB — CBC
HEMATOCRIT: 28.2 % — AB (ref 39.0–52.0)
Hemoglobin: 9.5 g/dL — ABNORMAL LOW (ref 13.0–17.0)
MCH: 29.3 pg (ref 26.0–34.0)
MCHC: 33.7 g/dL (ref 30.0–36.0)
MCV: 87 fL (ref 78.0–100.0)
PLATELETS: 282 10*3/uL (ref 150–400)
RBC: 3.24 MIL/uL — ABNORMAL LOW (ref 4.22–5.81)
RDW: 13.8 % (ref 11.5–15.5)
WBC: 13.1 10*3/uL — ABNORMAL HIGH (ref 4.0–10.5)

## 2017-01-25 LAB — MRSA PCR SCREENING: MRSA by PCR: POSITIVE — AB

## 2017-01-25 LAB — GLUCOSE, CAPILLARY
GLUCOSE-CAPILLARY: 136 mg/dL — AB (ref 65–99)
Glucose-Capillary: 152 mg/dL — ABNORMAL HIGH (ref 65–99)
Glucose-Capillary: 91 mg/dL (ref 65–99)

## 2017-01-25 SURGERY — EGD (ESOPHAGOGASTRODUODENOSCOPY)
Anesthesia: Monitor Anesthesia Care

## 2017-01-25 MED ORDER — FENTANYL CITRATE (PF) 100 MCG/2ML IJ SOLN
INTRAMUSCULAR | Status: DC | PRN
  Administered 2017-01-25 (×2): 50 ug via INTRAVENOUS

## 2017-01-25 MED ORDER — PANTOPRAZOLE SODIUM 40 MG PO TBEC: 40.0000 mg | DELAYED_RELEASE_TABLET | Freq: Two times a day (BID) | ORAL | 3 refills | Status: AC

## 2017-01-25 MED ORDER — ACETAMINOPHEN 325 MG PO TABS: 650.0000 mg | ORAL_TABLET | Freq: Four times a day (QID) | ORAL | 0 refills | Status: AC | PRN

## 2017-01-25 MED ORDER — CHLORHEXIDINE GLUCONATE CLOTH 2 % EX PADS
6.0000 | MEDICATED_PAD | Freq: Once | CUTANEOUS | Status: AC
  Administered 2017-01-25: 6 via TOPICAL

## 2017-01-25 MED ORDER — PANTOPRAZOLE SODIUM 40 MG PO TBEC: 40.0000 mg | DELAYED_RELEASE_TABLET | Freq: Two times a day (BID) | ORAL | 3 refills | Status: DC

## 2017-01-25 MED ORDER — SODIUM CHLORIDE 0.9 % IJ SOLN
INTRAMUSCULAR | Status: DC | PRN
  Administered 2017-01-25: 5 mL

## 2017-01-25 MED ORDER — CHLORHEXIDINE GLUCONATE CLOTH 2 % EX PADS
6.0000 | MEDICATED_PAD | Freq: Every day | CUTANEOUS | Status: DC
  Administered 2017-01-25: 6 via TOPICAL

## 2017-01-25 MED ORDER — LIDOCAINE 2% (20 MG/ML) 5 ML SYRINGE
INTRAMUSCULAR | Status: DC | PRN
  Administered 2017-01-25: 60 mg via INTRAVENOUS

## 2017-01-25 MED ORDER — CEFAZOLIN SODIUM-DEXTROSE 2-4 GM/100ML-% IV SOLN
2.0000 g | INTRAVENOUS | Status: DC
  Administered 2017-01-25: 2 g via INTRAVENOUS
  Filled 2017-01-25: qty 100

## 2017-01-25 MED ORDER — EPINEPHRINE PF 1 MG/10ML IJ SOSY
PREFILLED_SYRINGE | INTRAMUSCULAR | Status: AC
  Filled 2017-01-25: qty 10

## 2017-01-25 MED ORDER — ACETAMINOPHEN 325 MG PO TABS: 650.0000 mg | ORAL_TABLET | Freq: Four times a day (QID) | ORAL | 0 refills | Status: DC | PRN

## 2017-01-25 MED ORDER — SODIUM CHLORIDE 0.9 % IV SOLN: INTRAVENOUS | Status: DC

## 2017-01-25 MED ORDER — MUPIROCIN 2 % EX OINT
1.0000 "application " | TOPICAL_OINTMENT | Freq: Two times a day (BID) | CUTANEOUS | Status: DC
  Administered 2017-01-25: 1 via NASAL
  Filled 2017-01-25: qty 22

## 2017-01-25 MED ORDER — PROPOFOL 10 MG/ML IV BOLUS
INTRAVENOUS | Status: DC | PRN
  Administered 2017-01-25 (×3): 20 mg via INTRAVENOUS

## 2017-01-25 MED ORDER — ONDANSETRON HCL 4 MG/2ML IJ SOLN
INTRAMUSCULAR | Status: DC | PRN
  Administered 2017-01-25: 4 mg via INTRAVENOUS

## 2017-01-25 MED ORDER — MIDAZOLAM HCL 5 MG/5ML IJ SOLN
INTRAMUSCULAR | Status: DC | PRN
  Administered 2017-01-25 (×2): 1 mg via INTRAVENOUS

## 2017-01-25 MED ORDER — BUTAMBEN-TETRACAINE-BENZOCAINE 2-2-14 % EX AERO
INHALATION_SPRAY | CUTANEOUS | Status: DC | PRN
Start: 2017-01-25 — End: 2017-01-25
  Administered 2017-01-25: 2 via TOPICAL

## 2017-01-25 MED ORDER — CHLORHEXIDINE GLUCONATE CLOTH 2 % EX PADS: 6.0000 | MEDICATED_PAD | Freq: Once | CUTANEOUS | Status: AC

## 2017-01-25 NOTE — Interval H&P Note (Signed)
History and Physical Interval Note:  01/25/2017 10:23 AM  [name] [name]  has presented today for surgery, with the diagnosis of melena, anemia  The various methods of treatment have been discussed with the patient and family. After consideration of risks, benefits and other options for treatment, the patient has consented to  Procedure(s): ESOPHAGOGASTRODUODENOSCOPY (EGD) (N/A) as a surgical intervention .  The patient's history has been reviewed, patient examined, no change in status, stable for surgery.  I have reviewed the patient's chart and labs.  Questions were answered to the patient's satisfaction.     Silvano Rusk

## 2017-01-25 NOTE — Care Management Note (Signed)
Case Management Note  Patient Details  Name: Tramond Slinker MRN: [id number] Date of Birth: July 25, 1961  Subjective/Objective:  Presents with GIB, Transient hypotension, leukocytosis, aki, left ear pain, dm, hypokalemia. Left AMA.                  Action/Plan:   Expected Discharge Date:                  Expected Discharge Plan:  Home/Self Care  In-House Referral:     Discharge planning Services  CM Consult  Post Acute Care Choice:    Choice offered to:     DME Arranged:    DME Agency:     HH Arranged:    HH Agency:     Status of Service:  Completed, signed off  If discussed at [employer] of Stay Meetings, dates discussed:    Additional Comments:  Zenon Mayo, RN 01/25/2017, 3:50 PM

## 2017-01-25 NOTE — Discharge Summary (Signed)
AMA NOTE    Patient ID: [name] [name] MRN: [id number] DOB/AGE: 12-28-1960 56 y.o.  Admit date: 01/24/2017 Discharge date: 01/25/2017   PLEASE NOTE THAT PATIENT LEFT AGAINST MEDICAL ADVICE. Risks of rebleeding from the duodenal ulcer, severe anemia, syncope and death were explained in detail to the patient and he/she verbally understood the risks of leaving AMA.   Primary Care Physician:  Antonietta Jewel, MD  Discharge Diagnoses:    . 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 Hypotension . Leukocytosis . Hypokalemia . Acute renal failure superimposed on chronic kidney disease ([place]) . Left ear pain . Acute blood loss anemia   Consults:  gastroenterology   Recommendations for Outpatient Follow-up:    TESTS THAT NEED FOLLOW-UP CBC   DIET: patient was on clear liquid diet when he left AMA    Allergies:  No Known Allergies   Discharge Medications: Please note that patient left AMA (against medical advice)   Brief H and P: For complete details please refer to admission H and P, but in brief [name] [name] a 56 y.o.malewith medical history significant of DM type II andHTN; who presents after being found unresponsive in his car at a gas station. He was noted to be incontinent of dark bloody stool and initial blood pressures upon EMS arrival were noted to be 80/50 which improved enroutewith initial resuscitative efforts.Patient is a poor historian, but complains of approximately 2 days of dizziness/lightheadedness. He admits to taking ibuprofen up to 3 times daily for the last 3 weeks for what he reports as gallstones. Labs revealed WBC 17.4, hemoglobin 4.7,FOBT positive.   Hospital Course:  GI bleed with acute blood loss anemia: - initial hemoglobin of 4.7 and guaiac positive stools, Dark stools on history.  - Patient was placed on NPO, transfusion of 4 units packed RBCs, IV fluids, Protonix drip - GI consulted, patient underwent EGD which showed one  partially obstructing nonbleeding duodenal ulcer with very large nonbleeding visible vessel. NSAID-induced etiology suspected injected with epi. Nonbleeding erosive gastropathy - after the EGD, in the evening, around 3:30 PM, patient decided to leave AMA. I was called and I talked to the patient in detail at the bedside, explained all the risks of rebleeding from the large duodenal ulcer, nonhealing, syncope and death, he verbally acknowledged understanding all the risks. Patient however refused to stay any longer in the hospital and pulled out his IV line and called a cab to leave. The nurse, Everlene Other was also in the room and patient answered all the orientation questions correctly.  - I still gave him the prescription PROTONIX 40 mg twice a day and counseled him strongly not to take any kind of NSAIDs or alcohol.   Transient hypotension with H/O essential hypertension:  - Resolved. Blood pressures initially noted to be80/40 en route with EMS. - patient was continued on IV fluids and outpatient antihypertensives were held.   Leukocytosis: WBC elevated at 17.4. Chest x-ray shows borderline cardiomegaly, no acute infiltrate. Question if this is reactive versus possibility of underlying infection no other infectious symptoms noted. - WBC is improving, UA negative  AKI on chronic kidney disease stage III: Patient presents with creatinine 2.71 and BUN 50. Previous baseline creatinine noted to be 1.28-1.49. Suspect secondary to hypovolemia, NSAID induced acute kidney injury, severe anemia, medications.  - Improving, renal ultrasound was ordered however patient left AMA   Left ear pain: Acute. Patient notes left discomfort and decreased ability to hear -Outpatient ENT follow-up , patient left AMA  Diabetes  mellitus type 2: At home patient only on oral medications of metformin -metformin was held, patient was placed on sliding scale insulin  Hypokalemia: Acute - replaced  Day of Discharge BP  139/72   Pulse 72   Temp 98.6 F (37 C) (Axillary)   Resp 14   SpO2 96%   Physical Exam: General: Alert and awake oriented x3 not in any acute distress. HEENT: anicteric sclera, pupils reactive to light and accommodation CVS: S1-S2 clear no murmur rubs or gallops Chest: clear to auscultation bilaterally, no wheezing rales or rhonchi Abdomen: soft nontender, nondistended, normal bowel sounds Extremities: no cyanosis, clubbing or edema noted bilaterally Neuro: Cranial nerves II-XII intact, no focal neurological deficits   The results of significant diagnostics from this hospitalization (including imaging, microbiology, ancillary and laboratory) are listed below for reference.    LAB RESULTS: Basic Metabolic Panel:  Recent Labs Lab 01/24/17 1926 01/25/17 0745  NA 138 144  K 3.4* 3.9  CL 104 109  CO2 26 27  GLUCOSE 252* 103*  BUN 50* 36*  CREATININE 2.71* 2.03*  CALCIUM 7.4* 8.1*   Liver Function Tests:  Recent Labs Lab 01/24/17 1926  AST 20  ALT 11*  ALKPHOS 87  BILITOT 0.3  PROT 4.9*  ALBUMIN 2.0*    Recent Labs Lab 01/24/17 1926  LIPASE 21   No results for input(s): AMMONIA in the last 168 hours. CBC:  Recent Labs Lab 01/24/17 1926 01/25/17 0745  WBC 17.4* 13.1*  NEUTROABS 15.1*  --   HGB 4.7* 9.5*  HCT 14.4* 28.2*  MCV 86.2 87.0  PLT 332 282   Cardiac Enzymes: No results for input(s): CKTOTAL, CKMB, CKMBINDEX, TROPONINI in the last 168 hours. BNP: Invalid input(s): POCBNP CBG:  Recent Labs Lab 01/25/17 0654 01/25/17 1159  GLUCAP 91 136*    Significant Diagnostic Studies:  Dg Chest Port 1 View  Result Date: 01/24/2017 CLINICAL DATA:  Acute onset of nausea, vomiting and black stools. GI bleeding. Initial encounter. EXAM: PORTABLE CHEST 1 VIEW COMPARISON:  Chest radiograph performed 10/31/2006 FINDINGS: The lungs are well-aerated and clear. There is no evidence of focal opacification, pleural effusion or pneumothorax. The  cardiomediastinal silhouette is borderline enlarged. No acute osseous abnormalities are seen. IMPRESSION: Borderline cardiomegaly.  Lungs remain grossly clear. Electronically Signed   By: Garald Balding M.D.   On: 01/24/2017 21:21    2D ECHO:   Disposition and Follow-up:    DISPOSITION: Patient left AMA. He was advised to seek follow-up with primary care physician.     DISCHARGE FOLLOW-UP Follow-up Information    Silvano Rusk, MD. Schedule an appointment as soon as possible for a visit in 4 week(s).   Specialty:  Gastroenterology Contact information: [address] [place] [address] [phone]        HASSAN,SAMI, MD. Schedule an appointment as soon as possible for a visit in 1 week(s).   Specialty:  Internal Medicine Why:  follow CBC for blood counts  Contact information: [address] [place] [address] [phone]              Signed:   Estill Cotta M.D. [facility] 01/25/2017, 4:42 PM Pager: [phone]

## 2017-01-25 NOTE — Op Note (Signed)
[facility] Patient Name: [name] [name] Procedure Date : 01/25/2017 MRN: [id number] Attending MD: Gatha Mayer , MD Date of Birth: Aug 01, 1961 CSN: [id number] Age: 56 Admit Type: Inpatient Procedure:                Upper GI endoscopy Indications:              Melena Providers:                Gatha Mayer, MD, Burtis Junes, RN, Corliss Parish, Technician Referring MD:              Medicines:                Propofol per Anesthesia, Monitored Anesthesia Care Complications:            No immediate complications. Estimated Blood Loss:     Estimated blood loss was minimal. Procedure:                Pre-Anesthesia Assessment:                           - Prior to the procedure, a History and Physical                            was performed, and patient medications and                            allergies were reviewed. The patient's tolerance of                            previous anesthesia was also reviewed. The risks                            and benefits of the procedure and the sedation                            options and risks were discussed with the patient.                            All questions were answered, and informed consent                            was obtained. Prior Anticoagulants: The patient                            last took ibuprofen 1 day prior to the procedure.                            ASA Grade Assessment: III - A patient with severe                            systemic disease. After reviewing the risks and  benefits, the patient was deemed in satisfactory                            condition to undergo the procedure.                           After obtaining informed consent, the endoscope was                            passed under direct vision. Throughout the                            procedure, the patient's blood pressure, pulse, and                            oxygen saturations  were monitored continuously. The                            EG-[id number] ([id number]) scope was introduced through the                            mouth, and advanced to the second part of duodenum.                            The upper GI endoscopy was accomplished without                            difficulty. The patient tolerated the procedure                            well. Scope In: Scope Out: Findings:      One partially obstructing non-bleeding cratered duodenal ulcer with a       nonbleeding visible vessel (Forrest Class IIa) was found in the duodenal       bulb. Large non-bleeding vessel coursing through ulcer with slight       amount adherent clot. The lesion was 20 ++ mm in largest dimension. Area       was successfully injected with 5 mL of a 1:10,000 solution of       epinephrine for reduction of further bleeding. Estimated blood loss was       minimal.      Multiple dispersed, small non-bleeding erosions were found in the       prepyloric region of the stomach. There were no stigmata of recent       bleeding. Biopsies were taken with a cold forceps for Helicobacter       pylori testing using CLOtest. Verification of patient identification for       the specimen was done. Estimated blood loss was minimal.      The exam was otherwise without abnormality.      The cardia and gastric fundus were normal on retroflexion. Impression:               - One partially obstructing non-bleeding duodenal                            ulcer with  a very large nonbleeding visible vessel                            (Forrest Class IIa). NSAID induced etiology                            suspected. Injected with EPI - surrounding.                           - Non-bleeding erosive gastropathy. Biopsied.                           - The examination was otherwise normal. Moderate Sedation:      Please see anesthesia notes, moderate sedation not given Recommendation:           - Return patient to hospital ward  for ongoing care.                           - Continue present medications.                           - Clear liquid diet sips.                           - At high risk for further bleeding. Size of the                            visible vessel makes direct treapy with                            caoagulation or clips high-risk and less likely of                            success so I only injected EPI surrounding lesion.                            Stay on PPI infusion and close f/u - if more                            bleeding I think angio with embolization in GDA                            region sensible. Low threshold to send to step down                           I took H pylori bx - if negative could be from PPI                            but only on it for hrs at best so might be true                            result.  Will follow. Procedure Code(s):        --- Professional ---                           [phone], Esophagogastroduodenoscopy, flexible,                            transoral; with biopsy, single or multiple                           43236, 40, Esophagogastroduodenoscopy, flexible,                            transoral; with directed submucosal injection(s),                            any substance Diagnosis Code(s):        --- Professional ---                           V40.086P, Adverse effect of other nonsteroidal                            anti-inflammatory drugs [NSAID], sequela                           K26.4, Chronic or unspecified duodenal ulcer with                            hemorrhage                           K31.89, Other diseases of stomach and duodenum                           K92.1, Melena (includes Hematochezia) CPT copyright 2016 [employer] Association. All rights reserved. The codes documented in this report are preliminary and upon coder review may  be revised to meet current compliance requirements. Gatha Mayer,  MD 01/25/2017 11:16:41 AM This report has been signed electronically. Number of Addenda: 0

## 2017-01-25 NOTE — Anesthesia Preprocedure Evaluation (Signed)
Anesthesia Evaluation  Patient identified by MRN, date of birth, ID band Patient awake    Reviewed: Allergy & Precautions, NPO status , Patient's Chart, lab work & pertinent test results  History of Anesthesia Complications Negative for: history of anesthetic complications  Airway Mallampati: II  TM Distance: >3 FB Neck ROM: Full    Dental  (+) Teeth Intact   Pulmonary neg shortness of breath, neg sleep apnea, neg COPD, neg recent URI, former smoker,    breath sounds clear to auscultation       Cardiovascular hypertension, Pt. on medications  Rhythm:Regular     Neuro/Psych negative neurological ROS  negative psych ROS   GI/Hepatic PUD,   Endo/Other  diabetes, Type 2, Oral Hypoglycemic Agents  Renal/GU ARFRenal disease     Musculoskeletal   Abdominal   Peds  Hematology  (+) anemia ,   Anesthesia Other Findings   Reproductive/Obstetrics                             Anesthesia Physical Anesthesia Plan  ASA: II  Anesthesia Plan: MAC   Post-op Pain Management:    Induction: Intravenous  Airway Management Planned: Natural Airway, Nasal Cannula and Simple Face Mask  Additional Equipment:   Intra-op Plan:   Post-operative Plan: Extubation in OR  Informed Consent: I have reviewed the patients History and Physical, chart, labs and discussed the procedure including the risks, benefits and alternatives for the proposed anesthesia with the patient or authorized representative who has indicated his/her understanding and acceptance.   Dental advisory given  Plan Discussed with: CRNA and Surgeon  Anesthesia Plan Comments:         Anesthesia Quick Evaluation
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
## 2017-01-25 NOTE — Transfer of Care (Signed)
Immediate Anesthesia Transfer of Care Note  Patient: [name] [name]  Procedure(s) Performed: Procedure(s): ESOPHAGOGASTRODUODENOSCOPY (EGD) (N/A)  Patient Location: PACU and Endoscopy Unit  Anesthesia Type:MAC  Level of Consciousness: awake, alert , oriented and patient cooperative  Airway & Oxygen Therapy: Patient Spontanous Breathing and Patient connected to nasal cannula oxygen  Post-op Assessment: Report given to RN, Post -op Vital signs reviewed and stable and Patient moving all extremities  Post vital signs: Reviewed and stable  Last Vitals:  Vitals:   01/25/17 0732 01/25/17 1015  BP: 139/70 (!) 142/79  Pulse: 77 79  Resp: 17 17  Temp: 37.1 C     Last Pain:  Vitals:   01/25/17 1015  TempSrc: Oral         Complications: No apparent anesthesia complications
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
## 2017-01-25 NOTE — Progress Notes (Signed)
Pt left AMA - signed paper aware that means Against medical advice. Dr Tana Coast did give him 2 prescriptions - 1 for Protonix and 1 for tylenol Is aware that he should not take Ibuprofen or Motrin due to new diagnosis of ulcer Pt called for Yellow cab - Cab met pt at ED door

## 2017-01-25 NOTE — Progress Notes (Signed)
Pt had mixed medication in a pill bottle. Medication counted by this RN and Anderson Malta, RN and sent to pharmacy. Copy of counted medication sheet in chart.  Pt also had $248 in wallet. Money counted with patient and both RN's listed above. Money placed in necessary envelope and taken to Security. Copy of receipt in chart.

## 2017-01-25 NOTE — Progress Notes (Signed)
[facility]                                                                              Patient Demographics  [name] [name], is a 56 y.o. male, DOB - 10-09-1960, WSF:[id number]  Admit date - 01/24/2017   Admitting Physician Norval Morton, MD  Outpatient Primary MD for the patient is [facility], MD  Outpatient specialists:   LOS - 1  days    Chief Complaint  Patient presents with  . GI Bleeding       Brief summary   [name] [name] is a 56 y.o. male with medical history significant of DM type II and HTN; who presents after being found unresponsive in his car at a gas station. He was noted to be incontinent of dark bloody stool and initial blood pressures upon EMS arrival were noted to be 80/50 which improved enroute with initial resuscitative efforts. Patient is a poor historian, but complains of approximately 2 days of dizziness/lightheadedness. He admits to taking ibuprofen up to 3 times daily for the last 3 weeks for what he reports as gallstones.  Labs revealed WBC 17.4, hemoglobin 4.7,FOBT positive.   Assessment & Plan    Principal Problem: GI bleed with acute blood loss anemia: - initial hemoglobin of 4.7 and guaiac positive stools, Dark stools on history.  - Patient was placed on NPO, transfusion of 4 units packed RBCs, IV fluids, Protonix drip - GI consulted, patient underwent EGD which showed one partially obstructing nonbleeding duodenal ulcer with very large nonbleeding visible vessel. NSAID-induced etiology suspected injected with epi. Nonbleeding erosive gastropathy  Active problems Transient hypotension with H/O essential hypertension:  - Resolved. Blood pressures initially noted to be 80/40 en route with EMS. - Continue IV fluids, outpatient antihypertensives held   Leukocytosis: WBC elevated at 17.4. Chest x-ray shows borderline cardiomegaly, no acute infiltrate. Question if this is reactive versus possibility of underlying infection no other  infectious symptoms noted. - WBC is improving, UA negative  AKI on chronic kidney disease stage III: Patient presents with creatinine 2.71 and BUN 50. Previous baseline creatinine noted to be 1.28-1.49. Suspect secondary to hypovolemia, NSAID induced acute kidney injury, severe anemia, medications.  - Improving, follow renal ultrasound - Continue IV fluids - Hold HCTZ, lisinopril, NSAIDs  Left ear pain: Acute. Patient notes left discomfort and decreased ability to hear -Outpatient ENT follow-up   Diabetes mellitus type 2: At home patient only on oral medications of metformin -Hold metformin, continue sliding scale insulin  Hypokalemia: Acute - replaced  Code Status: full  DVT Prophylaxis:   SCD's Family Communication: Discussed in detail with the patient, all imaging results, lab results explained to the patient    Disposition Plan:   Time Spent in minutes  25 minutes  Procedures:  EGD  Consultants:   GI   Antimicrobials:   None    Medications  Scheduled Meds: . Chlorhexidine Gluconate Cloth  6 each Topical Q0600  . insulin aspart  0-9 Units Subcutaneous Q6H  . mupirocin ointment  1 application Nasal BID   Continuous Infusions: . pantoprozole (PROTONIX) infusion 8 mg/hr (01/25/17 1204)   PRN  Meds:.acetaminophen **OR** acetaminophen, albuterol, ondansetron **OR** ondansetron (ZOFRAN) IV   Antibiotics   Anti-infectives    Start     Dose/Rate Route Frequency Ordered Stop   01/25/17 0800  ceFAZolin (ANCEF) IVPB 2g/100 mL premix  Status:  Discontinued     2 g 200 mL/hr over 30 Minutes Intravenous On call to O.R. 01/25/17 0044 01/25/17 1210        Subjective:   [name] [name] was seen and examined today. He feels a lot better now, no dizziness. Patient denies chest pain, shortness of breath, abdominal pain, N/V/D/C, new weakness, numbess, tingling.   Objective:   Vitals:   01/25/17 1110 01/25/17 1120 01/25/17 1130 01/25/17 1200  BP: (!) 150/71 (!)  148/67 (!) 145/68 139/72  Pulse: 85 78 73 72  Resp: 19 15 13 14   Temp:    98.6 F (37 C)  TempSrc:    Axillary  SpO2: 98% 98% 96% 96%    Intake/Output Summary (Last 24 hours) at 01/25/17 1343 Last data filed at 01/25/17 1300  Gross per 24 hour  Intake             1884 ml  Output              202 ml  Net             1682 ml     Wt Readings from Last 3 Encounters:  10/16/16 68.9 kg (151 lb 14.4 oz)  10/06/16 72.6 kg (160 lb)  09/08/16 72.8 kg (160 lb 6.4 oz)     Exam  General: Alert and oriented x 3, NAD  HEENT:    Neck: Supple, no JVD, no masses  Cardiovascular: S1 S2 auscultated, no rubs, murmurs or gallops. Regular rate and rhythm.  Respiratory: Clear to auscultation bilaterally, no wheezing, rales or rhonchi  Gastrointestinal: Soft, nontender, nondistended, + bowel sounds  Ext: no cyanosis clubbing or edema  Neuro: AAOx3, Cr N's II- XII. Strength 5/5 upper and lower extremities bilaterally  Skin: No rashes  Psych: Normal affect and demeanor, alert and oriented x3    Data Reviewed:  I have personally reviewed following labs and imaging studies  Micro Results Recent Results (from the past 240 hour(s))  MRSA PCR Screening     Status: Abnormal   Collection Time: 01/25/17 12:40 AM  Result Value Ref Range Status   MRSA by PCR POSITIVE (A) NEGATIVE Final    Comment:        The GeneXpert MRSA Assay (FDA approved for NASAL specimens only), is one component of a comprehensive MRSA colonization surveillance program. It is not intended to diagnose MRSA infection nor to guide or monitor treatment for MRSA infections. RESULT CALLED TO, READ BACK BY AND VERIFIED WITH: Yolanda Bonine @0230  01/25/17 MKELLY,MLT     Radiology Reports Dg Chest Port 1 View  Result Date: 01/24/2017 CLINICAL DATA:  Acute onset of nausea, vomiting and black stools. GI bleeding. Initial encounter. EXAM: PORTABLE CHEST 1 VIEW COMPARISON:  Chest radiograph performed 10/31/2006 FINDINGS:  The lungs are well-aerated and clear. There is no evidence of focal opacification, pleural effusion or pneumothorax. The cardiomediastinal silhouette is borderline enlarged. No acute osseous abnormalities are seen. IMPRESSION: Borderline cardiomegaly.  Lungs remain grossly clear. Electronically Signed   By: Garald Balding M.D.   On: 01/24/2017 21:21    Lab Data:  CBC:  Recent Labs Lab 01/24/17 1926 01/25/17 0745  WBC 17.4* 13.1*  NEUTROABS 15.1*  --   HGB 4.7* 9.5*  HCT  14.4* 28.2*  MCV 86.2 87.0  PLT 332 976   Basic Metabolic Panel:  Recent Labs Lab 01/24/17 1926 01/25/17 0745  NA 138 144  K 3.4* 3.9  CL 104 109  CO2 26 27  GLUCOSE 252* 103*  BUN 50* 36*  CREATININE 2.71* 2.03*  CALCIUM 7.4* 8.1*   GFR: CrCl cannot be calculated (Unknown ideal weight.). Liver Function Tests:  Recent Labs Lab 01/24/17 1926  AST 20  ALT 11*  ALKPHOS 87  BILITOT 0.3  PROT 4.9*  ALBUMIN 2.0*    Recent Labs Lab 01/24/17 1926  LIPASE 21   No results for input(s): AMMONIA in the last 168 hours. Coagulation Profile:  Recent Labs Lab 01/24/17 1926  INR 1.28   Cardiac Enzymes: No results for input(s): CKTOTAL, CKMB, CKMBINDEX, TROPONINI in the last 168 hours. BNP (last 3 results) No results for input(s): PROBNP in the last 8760 hours. HbA1C: No results for input(s): HGBA1C in the last 72 hours. CBG:  Recent Labs Lab 01/25/17 0037 01/25/17 0654 01/25/17 1159  GLUCAP 152* 91 136*   Lipid Profile: No results for input(s): CHOL, HDL, LDLCALC, TRIG, CHOLHDL, LDLDIRECT in the last 72 hours. Thyroid Function Tests: No results for input(s): TSH, T4TOTAL, FREET4, T3FREE, THYROIDAB in the last 72 hours. Anemia Panel: No results for input(s): VITAMINB12, FOLATE, FERRITIN, TIBC, IRON, RETICCTPCT in the last 72 hours. Urine analysis:    Component Value Date/Time   COLORURINE YELLOW 01/24/2017 2152   APPEARANCEUR CLEAR 01/24/2017 2152   LABSPEC 1.014 01/24/2017 2152    PHURINE 5.0 01/24/2017 2152   GLUCOSEU NEGATIVE 01/24/2017 2152   HGBUR NEGATIVE 01/24/2017 2152   BILIRUBINUR NEGATIVE 01/24/2017 2152   KETONESUR NEGATIVE 01/24/2017 2152   PROTEINUR NEGATIVE 01/24/2017 2152   UROBILINOGEN 0.2 01/01/2011 1616   NITRITE NEGATIVE 01/24/2017 2152   LEUKOCYTESUR NEGATIVE 01/24/2017 2152     Amai Cappiello M.D. [facility] 01/25/2017, 1:43 PM  Pager: [phone] Between 7am to 7pm - call Pager - [phone]  After 7pm go to [email or web address] - password [email or web address]  Call night coverage person covering after 7pm
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
## 2017-01-26 LAB — TYPE AND SCREEN
ABO/RH(D): O POS
Antibody Screen: NEGATIVE
UNIT DIVISION: 0
UNIT DIVISION: 0
UNIT DIVISION: 0
Unit division: 0

## 2017-01-26 LAB — BPAM RBC
BLOOD PRODUCT EXPIRATION DATE: 201805202359
BLOOD PRODUCT EXPIRATION DATE: 201805202359
Blood Product Expiration Date: 201805202359
Blood Product Expiration Date: 201805202359
ISSUE DATE / TIME: 201804260352
ISSUE DATE / TIME: 201804252046
ISSUE DATE / TIME: 201804252232
ISSUE DATE / TIME: 201804260120
UNIT TYPE AND RH: 5100
UNIT TYPE AND RH: 5100
Unit Type and Rh: 5100
Unit Type and Rh: 5100

## 2017-01-26 LAB — CLOTEST (H. PYLORI), BIOPSY: HELICOBACTER SCREEN: NEGATIVE

## 2017-01-26 NOTE — Anesthesia Postprocedure Evaluation (Signed)
Anesthesia Post Note  Patient: [name] [name]  Procedure(s) Performed: Procedure(s) (LRB): ESOPHAGOGASTRODUODENOSCOPY (EGD) (N/A)  Patient location during evaluation: PACU Anesthesia Type: MAC Level of consciousness: awake and alert Pain management: pain level controlled Vital Signs Assessment: post-procedure vital signs reviewed and stable Respiratory status: spontaneous breathing, nonlabored ventilation, respiratory function stable and patient connected to nasal cannula oxygen Cardiovascular status: stable and blood pressure returned to baseline Anesthetic complications: no       Last Vitals:  Vitals:   01/25/17 1130 01/25/17 1200  BP: (!) 145/68 139/72  Pulse: 73 72  Resp: 13 14  Temp:  37 C    Last Pain:  Vitals:   01/25/17 1200  TempSrc: Axillary                 Amiayah Giebel

## 2017-01-27 ENCOUNTER — Inpatient Hospital Stay (HOSPITAL_COMMUNITY)
Admission: EM | Admit: 2017-01-27 | Discharge: 2017-01-29 | DRG: 378 | Discharge status: Against Medical Advice | Payer: BLUE CROSS/BLUE SHIELD | Attending: Internal Medicine | Admitting: Internal Medicine

## 2017-01-27 ENCOUNTER — Encounter (HOSPITAL_COMMUNITY): Payer: Self-pay

## 2017-01-27 ENCOUNTER — Encounter (HOSPITAL_COMMUNITY)
Admission: EM | Discharge status: Against Medical Advice | Payer: Self-pay | Source: Home / Self Care | Attending: Internal Medicine

## 2017-01-27 DIAGNOSIS — I129 Hypertensive chronic kidney disease with stage 1 through stage 4 chronic kidney disease, or unspecified chronic kidney disease: Secondary | ICD-10-CM | POA: Diagnosis present

## 2017-01-27 DIAGNOSIS — N189 Chronic kidney disease, unspecified: Secondary | ICD-10-CM

## 2017-01-27 DIAGNOSIS — K264 Chronic or unspecified duodenal ulcer with hemorrhage: Secondary | ICD-10-CM | POA: Diagnosis present

## 2017-01-27 DIAGNOSIS — N179 Acute kidney failure, unspecified: Secondary | ICD-10-CM | POA: Diagnosis present

## 2017-01-27 DIAGNOSIS — N183 Chronic kidney disease, stage 3 unspecified: Secondary | ICD-10-CM | POA: Insufficient documentation

## 2017-01-27 DIAGNOSIS — D72829 Elevated white blood cell count, unspecified: Secondary | ICD-10-CM | POA: Diagnosis present

## 2017-01-27 DIAGNOSIS — I10 Essential (primary) hypertension: Secondary | ICD-10-CM | POA: Diagnosis not present

## 2017-01-27 DIAGNOSIS — D62 Acute posthemorrhagic anemia: Secondary | ICD-10-CM | POA: Diagnosis present

## 2017-01-27 DIAGNOSIS — Z87891 Personal history of nicotine dependence: Secondary | ICD-10-CM

## 2017-01-27 DIAGNOSIS — R34 Anuria and oliguria: Secondary | ICD-10-CM | POA: Diagnosis present

## 2017-01-27 DIAGNOSIS — E1122 Type 2 diabetes mellitus with diabetic chronic kidney disease: Secondary | ICD-10-CM | POA: Diagnosis present

## 2017-01-27 DIAGNOSIS — E118 Type 2 diabetes mellitus with unspecified complications: Secondary | ICD-10-CM | POA: Diagnosis not present

## 2017-01-27 DIAGNOSIS — D649 Anemia, unspecified: Secondary | ICD-10-CM | POA: Diagnosis not present

## 2017-01-27 DIAGNOSIS — K922 Gastrointestinal hemorrhage, unspecified: Secondary | ICD-10-CM | POA: Diagnosis present

## 2017-01-27 DIAGNOSIS — E119 Type 2 diabetes mellitus without complications: Secondary | ICD-10-CM

## 2017-01-27 HISTORY — PX: ESOPHAGOGASTRODUODENOSCOPY: SHX5428

## 2017-01-27 LAB — COMPREHENSIVE METABOLIC PANEL WITH GFR
ALT: 10 U/L — ABNORMAL LOW (ref 17–63)
AST: 18 U/L (ref 15–41)
Albumin: 2.1 g/dL — ABNORMAL LOW (ref 3.5–5.0)
Alkaline Phosphatase: 67 U/L (ref 38–126)
Anion gap: 6 (ref 5–15)
BUN: 35 mg/dL — ABNORMAL HIGH (ref 6–20)
CO2: 27 mmol/L (ref 22–32)
Calcium: 7.8 mg/dL — ABNORMAL LOW (ref 8.9–10.3)
Chloride: 106 mmol/L (ref 101–111)
Creatinine, Ser: 2.58 mg/dL — ABNORMAL HIGH (ref 0.61–1.24)
GFR calc Af Amer: 30 mL/min — ABNORMAL LOW
GFR calc non Af Amer: 26 mL/min — ABNORMAL LOW
Glucose, Bld: 241 mg/dL — ABNORMAL HIGH (ref 65–99)
Potassium: 3.5 mmol/L (ref 3.5–5.1)
Sodium: 139 mmol/L (ref 135–145)
Total Bilirubin: 0.3 mg/dL (ref 0.3–1.2)
Total Protein: 4.5 g/dL — ABNORMAL LOW (ref 6.5–8.1)

## 2017-01-27 LAB — CBC
HCT: 16.4 % — ABNORMAL LOW (ref 39.0–52.0)
HCT: 26.6 % — ABNORMAL LOW (ref 39.0–52.0)
Hemoglobin: 5.2 g/dL — CL (ref 13.0–17.0)
Hemoglobin: 9 g/dL — ABNORMAL LOW (ref 13.0–17.0)
MCH: 28 pg (ref 26.0–34.0)
MCH: 28.6 pg (ref 26.0–34.0)
MCHC: 31.7 g/dL (ref 30.0–36.0)
MCHC: 33.8 g/dL (ref 30.0–36.0)
MCV: 88.2 fL (ref 78.0–100.0)
MCV: 84.4 fL (ref 78.0–100.0)
PLATELETS: 170 10*3/uL (ref 150–400)
Platelets: 266 K/uL (ref 150–400)
RBC: 1.86 MIL/uL — ABNORMAL LOW (ref 4.22–5.81)
RBC: 3.15 MIL/uL — AB (ref 4.22–5.81)
RDW: 13.6 % (ref 11.5–15.5)
RDW: 14 % (ref 11.5–15.5)
WBC: 18.7 K/uL — ABNORMAL HIGH (ref 4.0–10.5)
WBC: 19.8 10*3/uL — AB (ref 4.0–10.5)

## 2017-01-27 LAB — BASIC METABOLIC PANEL
Anion gap: 7 (ref 5–15)
BUN: 37 mg/dL — AB (ref 6–20)
CO2: 25 mmol/L (ref 22–32)
CREATININE: 2.19 mg/dL — AB (ref 0.61–1.24)
Calcium: 7.7 mg/dL — ABNORMAL LOW (ref 8.9–10.3)
Chloride: 107 mmol/L (ref 101–111)
GFR calc Af Amer: 37 mL/min — ABNORMAL LOW (ref >60)
GFR calc non Af Amer: 32 mL/min — ABNORMAL LOW (ref >60)
GLUCOSE: 248 mg/dL — AB (ref 65–99)
POTASSIUM: 4.2 mmol/L (ref 3.5–5.1)
SODIUM: 139 mmol/L (ref 135–145)

## 2017-01-27 LAB — URINALYSIS, ROUTINE W REFLEX MICROSCOPIC
Bilirubin Urine: NEGATIVE
Glucose, UA: NEGATIVE mg/dL
Hgb urine dipstick: NEGATIVE
Ketones, ur: 5 mg/dL — AB
Leukocytes, UA: NEGATIVE
Nitrite: NEGATIVE
Protein, ur: 30 mg/dL — AB
Specific Gravity, Urine: 1.019 (ref 1.005–1.030)
pH: 5 (ref 5.0–8.0)

## 2017-01-27 LAB — RAPID URINE DRUG SCREEN, HOSP PERFORMED
AMPHETAMINES: NOT DETECTED
Barbiturates: NOT DETECTED
Benzodiazepines: NOT DETECTED
Cocaine: NOT DETECTED
OPIATES: POSITIVE — AB
TETRAHYDROCANNABINOL: NOT DETECTED

## 2017-01-27 LAB — CBC WITH DIFFERENTIAL/PLATELET
BASOS PCT: 0 %
Basophils Absolute: 0 10*3/uL (ref 0.0–0.1)
Eosinophils Absolute: 0 10*3/uL (ref 0.0–0.7)
Eosinophils Relative: 0 %
HEMATOCRIT: 23.1 % — AB (ref 39.0–52.0)
HEMOGLOBIN: 7.7 g/dL — AB (ref 13.0–17.0)
Lymphocytes Relative: 8 %
Lymphs Abs: 1.6 10*3/uL (ref 0.7–4.0)
MCH: 29.1 pg (ref 26.0–34.0)
MCHC: 33.3 g/dL (ref 30.0–36.0)
MCV: 87.2 fL (ref 78.0–100.0)
MONOS PCT: 7 %
Monocytes Absolute: 1.3 10*3/uL — ABNORMAL HIGH (ref 0.1–1.0)
NEUTROS PCT: 85 %
Neutro Abs: 16.6 10*3/uL — ABNORMAL HIGH (ref 1.7–7.7)
Platelets: 227 10*3/uL (ref 150–400)
RBC: 2.65 MIL/uL — ABNORMAL LOW (ref 4.22–5.81)
RDW: 13.4 % (ref 11.5–15.5)
WBC: 19.6 10*3/uL — AB (ref 4.0–10.5)

## 2017-01-27 LAB — GLUCOSE, CAPILLARY
GLUCOSE-CAPILLARY: 235 mg/dL — AB (ref 65–99)
GLUCOSE-CAPILLARY: 196 mg/dL — AB (ref 65–99)
GLUCOSE-CAPILLARY: 237 mg/dL — AB (ref 65–99)
GLUCOSE-CAPILLARY: 128 mg/dL — AB (ref 65–99)
Glucose-Capillary: 242 mg/dL — ABNORMAL HIGH (ref 65–99)

## 2017-01-27 LAB — APTT: aPTT: 24 seconds (ref 24–36)

## 2017-01-27 LAB — PREPARE RBC (CROSSMATCH)

## 2017-01-27 LAB — PROTIME-INR
INR: 1.31
PROTHROMBIN TIME: 16.3 s — AB (ref 11.4–15.2)

## 2017-01-27 LAB — LIPASE, BLOOD: Lipase: 25 U/L (ref 11–51)

## 2017-01-27 SURGERY — EGD (ESOPHAGOGASTRODUODENOSCOPY)
Anesthesia: Moderate Sedation

## 2017-01-27 MED ORDER — ACETAMINOPHEN 325 MG PO TABS
650.0000 mg | ORAL_TABLET | Freq: Four times a day (QID) | ORAL | Status: DC | PRN
Start: 2017-01-27 — End: 2017-01-29

## 2017-01-27 MED ORDER — FENTANYL CITRATE (PF) 100 MCG/2ML IJ SOLN
INTRAMUSCULAR | Status: DC | PRN
  Administered 2017-01-27: 25 ug via INTRAVENOUS

## 2017-01-27 MED ORDER — SODIUM CHLORIDE 0.9% FLUSH
3.0000 mL | Freq: Two times a day (BID) | INTRAVENOUS | Status: DC
  Administered 2017-01-27 – 2017-01-28 (×3): 3 mL via INTRAVENOUS

## 2017-01-27 MED ORDER — DIPHENHYDRAMINE HCL 50 MG/ML IJ SOLN
INTRAMUSCULAR | Status: AC
  Filled 2017-01-27: qty 1

## 2017-01-27 MED ORDER — SODIUM CHLORIDE 0.9 % IV SOLN: INTRAVENOUS | Status: DC

## 2017-01-27 MED ORDER — LORAZEPAM 2 MG/ML IJ SOLN
0.5000 mg | INTRAMUSCULAR | Status: DC | PRN
  Administered 2017-01-27 – 2017-01-28 (×4): 1 mg via INTRAVENOUS
  Filled 2017-01-27 (×4): qty 1

## 2017-01-27 MED ORDER — CYCLOBENZAPRINE HCL 10 MG PO TABS: 10.0000 mg | ORAL_TABLET | Freq: Three times a day (TID) | ORAL | Status: DC | PRN

## 2017-01-27 MED ORDER — ONDANSETRON HCL 4 MG/2ML IJ SOLN
4.0000 mg | Freq: Four times a day (QID) | INTRAMUSCULAR | Status: DC | PRN
  Administered 2017-01-27 – 2017-01-28 (×3): 4 mg via INTRAVENOUS
  Filled 2017-01-27 (×3): qty 2

## 2017-01-27 MED ORDER — SODIUM CHLORIDE 0.9 % IV SOLN: Freq: Once | INTRAVENOUS | Status: DC

## 2017-01-27 MED ORDER — DIPHENHYDRAMINE HCL 25 MG PO CAPS: 25.0000 mg | ORAL_CAPSULE | Freq: Four times a day (QID) | ORAL | Status: DC | PRN

## 2017-01-27 MED ORDER — MIDAZOLAM HCL 5 MG/ML IJ SOLN
INTRAMUSCULAR | Status: AC
  Filled 2017-01-27: qty 3

## 2017-01-27 MED ORDER — MIDAZOLAM HCL 10 MG/2ML IJ SOLN
INTRAMUSCULAR | Status: DC | PRN
  Administered 2017-01-27: 2 mg via INTRAVENOUS
  Administered 2017-01-27: 1 mg via INTRAVENOUS

## 2017-01-27 MED ORDER — ACETAMINOPHEN 650 MG RE SUPP: 650.0000 mg | Freq: Four times a day (QID) | RECTAL | Status: DC | PRN

## 2017-01-27 MED ORDER — GABAPENTIN 300 MG PO CAPS
300.0000 mg | ORAL_CAPSULE | Freq: Three times a day (TID) | ORAL | Status: DC | PRN
  Filled 2017-01-27: qty 1

## 2017-01-27 MED ORDER — SODIUM CHLORIDE 0.9 % IV SOLN: 10.0000 mL/h | Freq: Once | INTRAVENOUS | Status: DC

## 2017-01-27 MED ORDER — SODIUM CHLORIDE 0.9 % IV SOLN
8.0000 mg/h | INTRAVENOUS | Status: DC
  Administered 2017-01-27 – 2017-01-28 (×3): 8 mg/h via INTRAVENOUS
  Filled 2017-01-27 (×11): qty 80

## 2017-01-27 MED ORDER — INSULIN ASPART 100 UNIT/ML ~~LOC~~ SOLN
0.0000 [IU] | SUBCUTANEOUS | Status: DC
  Administered 2017-01-27: 4 [IU] via SUBCUTANEOUS
  Administered 2017-01-27 (×2): 3 [IU] via SUBCUTANEOUS
  Administered 2017-01-27: 2 [IU] via SUBCUTANEOUS
  Administered 2017-01-27 – 2017-01-29 (×7): 1 [IU] via SUBCUTANEOUS
  Administered 2017-01-29: 2 [IU] via SUBCUTANEOUS

## 2017-01-27 MED ORDER — EPINEPHRINE PF 1 MG/10ML IJ SOSY
PREFILLED_SYRINGE | INTRAMUSCULAR | Status: DC | PRN
  Administered 2017-01-27: 4 mL

## 2017-01-27 MED ORDER — SODIUM CHLORIDE 0.9 % IV SOLN
25.0000 ug/h | INTRAVENOUS | Status: DC
  Administered 2017-01-27 (×2): 25 ug/h via INTRAVENOUS
  Filled 2017-01-27: qty 1

## 2017-01-27 MED ORDER — OCTREOTIDE ACETATE 100 MCG/ML IJ SOLN
100.0000 ug | Freq: Once | INTRAMUSCULAR | Status: DC
  Filled 2017-01-27: qty 1

## 2017-01-27 MED ORDER — ONDANSETRON HCL 4 MG/2ML IJ SOLN: 4.0000 mg | Freq: Four times a day (QID) | INTRAMUSCULAR | Status: DC | PRN

## 2017-01-27 MED ORDER — FENTANYL CITRATE (PF) 100 MCG/2ML IJ SOLN
INTRAMUSCULAR | Status: AC
  Filled 2017-01-27: qty 4

## 2017-01-27 MED ORDER — ONDANSETRON HCL 4 MG PO TABS
4.0000 mg | ORAL_TABLET | Freq: Four times a day (QID) | ORAL | Status: DC | PRN
Start: 2017-01-27 — End: 2017-01-27

## 2017-01-27 MED ORDER — PANTOPRAZOLE SODIUM 40 MG IV SOLR
40.0000 mg | Freq: Once | INTRAVENOUS | Status: AC
  Administered 2017-01-27: 40 mg via INTRAVENOUS
  Filled 2017-01-27: qty 40

## 2017-01-27 MED ORDER — HALOPERIDOL LACTATE 5 MG/ML IJ SOLN: 2.0000 mg | Freq: Four times a day (QID) | INTRAMUSCULAR | Status: DC | PRN

## 2017-01-27 MED ORDER — PANTOPRAZOLE SODIUM 40 MG IV SOLR: 40.0000 mg | Freq: Two times a day (BID) | INTRAVENOUS | Status: DC

## 2017-01-27 MED ORDER — EPINEPHRINE PF 1 MG/10ML IJ SOSY
PREFILLED_SYRINGE | INTRAMUSCULAR | Status: AC
  Filled 2017-01-27: qty 30

## 2017-01-27 MED ORDER — BUTALBITAL-APAP-CAFFEINE 50-325-40 MG PO TABS: 1.0000 | ORAL_TABLET | Freq: Two times a day (BID) | ORAL | Status: DC | PRN

## 2017-01-27 MED ORDER — SODIUM CHLORIDE 0.9 % IV SOLN
INTRAVENOUS | Status: DC
  Administered 2017-01-27 (×2): via INTRAVENOUS

## 2017-01-27 MED ORDER — DIPHENHYDRAMINE HCL 50 MG/ML IJ SOLN
INTRAMUSCULAR | Status: DC | PRN
  Administered 2017-01-27: 25 mg via INTRAVENOUS

## 2017-01-27 MED ORDER — PRAVASTATIN SODIUM 10 MG PO TABS: 10.0000 mg | ORAL_TABLET | Freq: Every day | ORAL | Status: DC

## 2017-01-27 MED ORDER — SODIUM CHLORIDE 0.9 % IV BOLUS (SEPSIS)
500.0000 mL | Freq: Once | INTRAVENOUS | Status: AC
  Administered 2017-01-27: 500 mL via INTRAVENOUS

## 2017-01-27 NOTE — ED Provider Notes (Signed)
[place] DEPT Provider Note   CSN: [id number] Arrival date & time: 01/27/17  0043     History   Chief Complaint Chief Complaint  Patient presents with  . Abdominal Pain  . Abnormal Lab    Hgb = 5.2    HPI [name] [name] is a 56 y.o. male.  Patient with recent admission for known bleeding duodenal ulcer -- presents with worsening symptoms. He presented with anemia on 4/25; hgb 4.7. Patient had endoscopy on 4/26 and left AMA after that procedure. Patient was transfused 4 units of blood and left with hemoglobin and 9.5. Since returning home, patient has continued to have stools which he describes as dark red to black. He feels near syncopal with standing and short of breath with exertion. He has severe fatigue. Hgb 9.5 --> 5.2 tonight. Admits to taking ibuprofen one time at home since hospitalization. Denies any alcohol use. The onset of this condition was acute. The course is worsening. Aggravating factors: none. Alleviating factors: none.        Past Medical History:  Diagnosis Date  . 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 Diabetes mellitus without complication ([place])   . Gallstones   . Hypertension     Patient Active Problem List   Diagnosis Date Noted  . AKI (acute kidney injury) ([place])   . 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 Hypotension 01/24/2017  . Leukocytosis 01/24/2017  . Hypokalemia 01/24/2017  . Acute renal failure superimposed on chronic kidney disease ([place]) 01/24/2017  . Left ear pain 01/24/2017  . Acute blood loss anemia 01/24/2017  . Hypertension 01/01/2011  . Diabetes mellitus ([place]) 01/01/2011    Past Surgical History:  Procedure Laterality Date  . COLONOSCOPY  10/2016  . FRACTURE SURGERY     right ankle       Home Medications    Prior to Admission medications   Medication Sig Start Date End Date Taking? Authorizing Provider  acetaminophen (TYLENOL) 325 MG tablet Take 2  tablets (650 mg total) by mouth every 6 (six) hours as needed for mild pain. Over the counter 01/25/17   Ripudeep K Rai, MD  butalbital-acetaminophen-caffeine (FIORICET, ESGIC) 50-325-40 MG per tablet Take 1 tablet by mouth 2 (two) times daily as needed for headache.    Historical Provider, MD  cyclobenzaprine (FLEXERIL) 10 MG tablet Take 10 mg by mouth 3 (three) times daily as needed for muscle spasms.    Historical Provider, MD  diphenhydrAMINE (BENADRYL) 25 MG tablet Take 1 tablet (25 mg total) by mouth every 6 (six) hours. Patient taking differently: Take 25 mg by mouth every 6 (six) hours as needed for itching or allergies.  08/08/14   Ripley Fraise, MD  gabapentin (NEURONTIN) 300 MG capsule Take 300 mg by mouth 3 (three) times daily as needed (pain).     Historical Provider, MD  lovastatin (MEVACOR) 10 MG tablet Take 10 mg by mouth every morning.    Historical Provider, MD  pantoprazole (PROTONIX) 40 MG tablet Take 1 tablet (40 mg total) by mouth 2 (two) times daily before a meal. Before breakfast and supper 01/25/17   Ripudeep Krystal Eaton, MD    Family History Family History  Problem Relation Age of Onset  . Colon cancer Neg Hx     Social History Social History  Substance Use Topics  . Smoking status: Former Research scientist (life sciences)  . Smokeless tobacco: Never Used  . Alcohol use Yes     Comment: twice a month  Allergies   Patient has no known allergies.   Review of Systems Review of Systems  Constitutional: Positive for fatigue. Negative for fever.  HENT: Negative for rhinorrhea and sore throat.   Eyes: Negative for redness.  Respiratory: Positive for shortness of breath. Negative for cough.   Cardiovascular: Negative for chest pain.  Gastrointestinal: Positive for abdominal pain (epigastric), blood in stool and nausea. Negative for diarrhea and vomiting.  Genitourinary: Negative for dysuria.  Musculoskeletal: Negative for myalgias.  Skin: Negative for rash.  Neurological: Positive for  light-headedness. Negative for headaches.     Physical Exam Updated Vital Signs BP 124/68   Pulse 72   Temp 98.4 F (36.9 C)   Resp 16   SpO2 99%   Physical Exam  Constitutional: He appears well-developed and well-nourished.  HENT:  Head: Normocephalic and atraumatic.  Mouth/Throat: Oropharynx is clear and moist.  Eyes: Pupils are equal, round, and reactive to light. Right eye exhibits no discharge. Left eye exhibits no discharge.  Conjunctiva pallor noted  Neck: Normal range of motion. Neck supple.  Cardiovascular: Normal rate, regular rhythm and normal heart sounds.   No tachycardia  Pulmonary/Chest: Effort normal and breath sounds normal. No respiratory distress. He has no wheezes. He has no rales.  Abdominal: Soft. There is tenderness (mild, epigastrium). There is no rebound and no guarding.  Neurological: He is alert.  Skin: Skin is warm and dry.  Psychiatric: He has a normal mood and affect.  Nursing note and vitals reviewed.    ED Treatments / Results  Labs (all labs ordered are listed, but only abnormal results are displayed) Labs Reviewed  COMPREHENSIVE METABOLIC PANEL - Abnormal; Notable for the following:       Result Value   Glucose, Bld 241 (*)    BUN 35 (*)    Creatinine, Ser 2.58 (*)    Calcium 7.8 (*)    Total Protein 4.5 (*)    Albumin 2.1 (*)    ALT 10 (*)    GFR calc non Af Amer 26 (*)    GFR calc Af Amer 30 (*)    All other components within normal limits  CBC - Abnormal; Notable for the following:    WBC 18.7 (*)    RBC 1.86 (*)    Hemoglobin 5.2 (*)    HCT 16.4 (*)    All other components within normal limits  URINALYSIS, ROUTINE W REFLEX MICROSCOPIC - Abnormal; Notable for the following:    APPearance HAZY (*)    Ketones, ur 5 (*)    Protein, ur 30 (*)    Bacteria, UA RARE (*)    Squamous Epithelial / LPF 0-5 (*)    All other components within normal limits  LIPASE, BLOOD  TYPE AND SCREEN  PREPARE RBC (CROSSMATCH)    EKG  EKG  Interpretation None       Procedures Procedures (including critical care time)  Medications Ordered in ED Medications  pantoprazole (PROTONIX) injection 40 mg (not administered)  0.9 %  sodium chloride infusion (not administered)  sodium chloride 0.9 % bolus 500 mL (not administered)     Initial Impression / Assessment and Plan / ED Course  I have reviewed the triage vital signs and the nursing notes.  Pertinent labs & imaging results that were available during my care of the patient were reviewed by me and considered in my medical decision making (see chart for details).     Patient seen and examined. Work-up initiated. Medications/blood ordered.  Vital signs reviewed and are as follows: BP (!) 119/57   Pulse 66   Temp 98.4 F (36.9 C)   Resp 15   SpO2 100%   Discussed with Dr. Claudine Mouton. Will admit.   2:39 AM Spoke with Dr. Myna Hidalgo who will see.   CRITICAL CARE Performed by: Faustino Congress Total critical care time: 30 minutes Critical care time was exclusive of separately billable procedures and treating other patients. Critical care was necessary to treat or prevent imminent or life-threatening deterioration. Critical care was time spent personally by me on the following activities: development of treatment plan with patient and/or surrogate as well as nursing, discussions with consultants, evaluation of patient's response to treatment, examination of patient, obtaining history from patient or surrogate, ordering and performing treatments and interventions, ordering and review of laboratory studies, ordering and review of radiographic studies, pulse oximetry and re-evaluation of patient's condition.   Final Clinical Impressions(s) / ED Diagnoses   Final diagnoses:  Acute upper GI bleeding  Symptomatic anemia  Acute kidney injury ([place])   Admit.   New Prescriptions New Prescriptions   No medications on file     Carlisle Cater, PA-C 01/27/17 [place], MD 01/27/17 [phone]

## 2017-01-27 NOTE — Progress Notes (Signed)
[place] TEAM 1 - Stepdown/ICU TEAM  [name] [name]  WPY:[id number] DOB: [birth year] DOA: 01/27/2017 PCP: Antonietta Jewel, MD    Brief Narrative:  56 y.o. male with history of cholelithiasis, HTN, DM2, chronic kidney disease stage III, and duodenal ulcer with hemorrhage who left the hospital AMA on 01/25/2017 despite his critical illness and despite being warned that he could very well die. He had been admitted on 01/24/2017 after being found unresponsive in his car sitting in a dark bloody stool with blood pressure of 80/50. He had been using Advil TID. He underwent EGD the following morning with identification of a large duodenal ulcer with hemorrhage. A large visible vessel was injected with epinephrine and it was advised that the patient be continued on Protonix infusion. He reported feeling well the following morning, so he left the hospital, but shortly after returning home, has continued to have melena and once again became fatigued, generally weak, and lightheaded upon standing. He was unable to provide any cogent or reasonably logical explanation for why he left.   Upon arrival to the ED CBC featured a hemoglobin of 5.2.   Subjective: Pt is seen for a f/u visit.  He is arguing with the nurse and insisting that he be allowed to get up and go to the bathroom or use the bedside commode despite the fact that he is displaying clear orthostasis.  When I am not present he is reportedly using racial slurs and being very insulting with the nursing staff.  I explained to the patient the fact that he has a large bleeding ulcer and that his life is in danger and that if he does not allow [place] to treat him that he will probably die.  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 - melena persists - has known PUD and a large visible vessel was injected with epi on 01/25/17 - resumed protonix infusion - if bleeding does not cease the next step will be IR embolization   Acute blood-loss  anemia  - found to have Hgb of 5.2 at re-presentation - Hgb was 4.7 at time of admission on 4/25, then up to 9.5 after 4 units RBCs - 2 additional units pRBCs ordered for transfusion  Recent Labs Lab 01/24/17 1926 01/25/17 0745 01/27/17 0058  HGB 4.7* 9.5* 5.2*    Acute kidney injury superimposed on CKD stage III  - SCr is 2.58 on admission, up from 2.0 on 01/25/17 - likely prerenal in setting of continued GI bleeding - baseline creatinine reportedly 1.3-1.5  Hypertension  - BP is a little soft on presentation; will hydrate with NS and transfuse  Type II DM  - follow w/ SSI while NPO  MRSA screen +  DVT prophylaxis: SCDs Code Status: FULL CODE Family Communication: no family present at time of exam  Disposition Plan: remain in SDU/ICU due to high risk for life threatening hemorrhagic shock  Consultants:  None this admit - GI prior admit   Procedures: None   Antimicrobials:  none  Objective: Blood pressure 126/65, pulse 68, temperature 98.2 F (36.8 C), temperature source Oral, resp. rate 16, height 5\' 11"  (1.803 m), weight 66.9 kg (147 lb 7.8 oz), SpO2 100 %.  Intake/Output Summary (Last 24 hours) at 01/27/17 0810 Last data filed at 01/27/17 0700  Gross per 24 hour  Intake            520.5 ml  Output  250 ml  Net            270.5 ml   Filed Weights   01/27/17 0541  Weight: 66.9 kg (147 lb 7.8 oz)    Examination: Pt was seen for a f/u visit.    CBC:  Recent Labs Lab 01/24/17 1926 01/25/17 0745 01/27/17 0058  WBC 17.4* 13.1* 18.7*  NEUTROABS 15.1*  --   --   HGB 4.7* 9.5* 5.2*  HCT 14.4* 28.2* 16.4*  MCV 86.2 87.0 88.2  PLT 332 282 993   Basic Metabolic Panel:  Recent Labs Lab 01/24/17 1926 01/25/17 0745 01/27/17 0058 01/27/17 0426  NA 138 144 139 139  K 3.4* 3.9 3.5 4.2  CL 104 109 106 107  CO2 26 27 27 25   GLUCOSE 252* 103* 241* 248*  BUN 50* 36* 35* 37*  CREATININE 2.71* 2.03* 2.58* 2.19*  CALCIUM 7.4* 8.1* 7.8* 7.7*     GFR: Estimated Creatinine Clearance: 35.2 mL/min (A) (by C-G formula based on SCr of 2.19 mg/dL (H)).  Liver Function Tests:  Recent Labs Lab 01/24/17 1926 01/27/17 0058  AST 20 18  ALT 11* 10*  ALKPHOS 87 67  BILITOT 0.3 0.3  PROT 4.9* 4.5*  ALBUMIN 2.0* 2.1*    Recent Labs Lab 01/24/17 1926 01/27/17 0058  LIPASE 21 25    Coagulation Profile:  Recent Labs Lab 01/24/17 1926  INR 1.28    HbA1C: Hgb A1c MFr Bld  Date/Time Value Ref Range Status  01/02/2011 05:03 AM (H) <5.7 % Final   8.7 (NOTE)                                                                       According to the ADA Clinical Practice Recommendations for 2011, when HbA1c is used as a screening test:   >=6.5%   Diagnostic of Diabetes Mellitus           (if abnormal result  is confirmed)  5.7-6.4%   Increased risk of developing Diabetes Mellitus  References:Diagnosis and Classification of Diabetes Mellitus,Diabetes ZJIR,6789,38(BOFBP 1):S62-S69 and Standards of Medical Care in         Diabetes - 2011,Diabetes ZWCH,8527,78  (Suppl 1):S11-S61.    CBG:  Recent Labs Lab 01/25/17 0037 01/25/17 0654 01/25/17 1159 01/27/17 0553 01/27/17 0746  GLUCAP 152* 91 136* 235* 196*    Recent Results (from the past 240 hour(s))  MRSA PCR Screening     Status: Abnormal   Collection Time: 01/25/17 12:40 AM  Result Value Ref Range Status   MRSA by PCR POSITIVE (A) NEGATIVE Final    Comment:        The GeneXpert MRSA Assay (FDA approved for NASAL specimens only), is one component of a comprehensive MRSA colonization surveillance program. It is not intended to diagnose MRSA infection nor to guide or monitor treatment for MRSA infections. RESULT CALLED TO, READ BACK BY AND VERIFIED WITH: Yolanda Bonine @0230  01/25/17 MKELLY,MLT      Scheduled Meds: . insulin aspart  0-9 Units Subcutaneous Q4H  . [START ON 01/30/2017] pantoprazole  40 mg Intravenous Q12H  . [START ON 01/28/2017] pravastatin  10 mg Oral  q1800  . sodium chloride flush  3 mL Intravenous Q12H   Continuous  Infusions: . sodium chloride    . sodium chloride    . pantoprozole (PROTONIX) infusion 8 mg/hr (01/27/17 0618)     LOS: 0 days   Time spent: No Charge  Cherene Altes, MD [facility] Office  [phone] Pager - Text Page per Amion as per below:  On-Call/Text Page:      [email or web address]      password [email or web address]  If 7PM-7AM, please contact night-coverage [email or web address] Password [email or web address] 01/27/2017, 8:10 AM

## 2017-01-27 NOTE — Consult Note (Signed)
Chief Complaint: Patient was seen in consultation today for abdominal pain  Referring Physician(s):  Dr. Joette Catching  Supervising Physician: Markus Daft  Patient Status: [facility] - In-pt  History of Present Illness: [name] [name] is a 56 y.o. male with past medical history of DM, HTN, gallstone, and chronic duodenal ulcers with hemorrhage and obstruction who was last admitted 01/24/17 GI bleed.  Patient underwent EGD which showed large duodenal ulcer with hemorrhage which was injected with epi and a large non-bleeding vessel.  After EGD, patient left hospital AMA.  He continued to have weakness and melena at home.  He returned to the ED 01/27/17 with HgB of 5.2.  He has continued to have hematemesis of large volumes and melena.   IR consulted for angiogram with possible embolization.  Dr. Anselm Pancoast discussed case with Dr. Thereasa Solo as well as GI service.   Past Medical History:  Diagnosis Date  . 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 Diabetes mellitus without complication ([place])   . Gallstones   . Hypertension     Past Surgical History:  Procedure Laterality Date  . COLONOSCOPY  10/2016  . FRACTURE SURGERY     right ankle    Allergies: Patient has no known allergies.  Medications: Prior to Admission medications   Medication Sig Start Date End Date Taking? Authorizing Provider  acetaminophen (TYLENOL) 325 MG tablet Take 2 tablets (650 mg total) by mouth every 6 (six) hours as needed for mild pain. Over the counter 01/25/17  Yes Ripudeep Krystal Eaton, MD  butalbital-acetaminophen-caffeine (FIORICET, ESGIC) 50-325-40 MG per tablet Take 1 tablet by mouth 2 (two) times daily as needed for headache.   Yes Historical Provider, MD  cyclobenzaprine (FLEXERIL) 10 MG tablet Take 10 mg by mouth 3 (three) times daily as needed for muscle spasms.   Yes Historical Provider, MD  diphenhydrAMINE (BENADRYL) 25 MG tablet Take 1 tablet (25 mg total) by mouth every 6 (six)  hours. Patient taking differently: Take 25 mg by mouth every 6 (six) hours as needed for itching or allergies.  08/08/14  Yes Ripley Fraise, MD  gabapentin (NEURONTIN) 300 MG capsule Take 300 mg by mouth 3 (three) times daily as needed (pain).    Yes Historical Provider, MD  lovastatin (MEVACOR) 10 MG tablet Take 10 mg by mouth every morning.   Yes Historical Provider, MD  pantoprazole (PROTONIX) 40 MG tablet Take 1 tablet (40 mg total) by mouth 2 (two) times daily before a meal. Before breakfast and supper 01/25/17  Yes Ripudeep Krystal Eaton, MD     Family History  Problem Relation Age of Onset  . Colon cancer Neg Hx     Social History   Social History  . Marital status: Divorced    Spouse name: N/A  . Number of children: N/A  . Years of education: N/A   Occupational History  . 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 Smoking status: Former Research scientist (life sciences)  . Smokeless tobacco: Never Used  . Alcohol use Yes     Comment: twice a month  . Drug use: No  . Sexual activity: Not Currently   Other Topics Concern  . None   Social History Narrative  . None    Review of Systems  Constitutional: Negative for fatigue and fever.  Respiratory: Negative for cough and shortness of breath.   Cardiovascular: Negative for chest pain.  Gastrointestinal: Positive for  abdominal pain and blood in stool.    Vital Signs: BP 126/65   Pulse 68   Temp 98.4 F (36.9 C) (Oral)   Resp 16   Ht 5\' 11"  (1.803 m)   Wt 147 lb 7.8 oz (66.9 kg)   SpO2 100%   BMI 20.57 kg/m   Physical Exam  Constitutional: He appears well-developed.  Cardiovascular: Normal rate and regular rhythm.   Pulmonary/Chest: Effort normal and breath sounds normal.  Abdominal: Soft. He exhibits no distension. There is no tenderness.  Skin: There is pallor.  Nursing note and vitals reviewed.   Mallampati Score:  MD Evaluation Airway: WNL Heart: WNL Abdomen: WNL Chest/ Lungs: WNL ASA   Classification: 3 Mallampati/Airway Score: Two  Imaging: Dg Chest Port 1 View  Result Date: 01/24/2017 CLINICAL DATA:  Acute onset of nausea, vomiting and black stools. GI bleeding. Initial encounter. EXAM: PORTABLE CHEST 1 VIEW COMPARISON:  Chest radiograph performed 10/31/2006 FINDINGS: The lungs are well-aerated and clear. There is no evidence of focal opacification, pleural effusion or pneumothorax. The cardiomediastinal silhouette is borderline enlarged. No acute osseous abnormalities are seen. IMPRESSION: Borderline cardiomegaly.  Lungs remain grossly clear. Electronically Signed   By: Garald Balding M.D.   On: 01/24/2017 21:21    Labs:  CBC:  Recent Labs  01/24/17 1926 01/25/17 0745 01/27/17 0058  WBC 17.4* 13.1* 18.7*  HGB 4.7* 9.5* 5.2*  HCT 14.4* 28.2* 16.4*  PLT 332 282 266    COAGS:  Recent Labs  01/24/17 1926  INR 1.28  APTT 24    BMP:  Recent Labs  01/24/17 1926 01/25/17 0745 01/27/17 0058 01/27/17 0426  NA 138 144 139 139  K 3.4* 3.9 3.5 4.2  CL 104 109 106 107  CO2 26 27 27 25   GLUCOSE 252* 103* 241* 248*  BUN 50* 36* 35* 37*  CALCIUM 7.4* 8.1* 7.8* 7.7*  CREATININE 2.71* 2.03* 2.58* 2.19*  GFRNONAA 24* 35* 26* 32*  GFRAA 28* 40* 30* 37*    LIVER FUNCTION TESTS:  Recent Labs  01/24/17 1926 01/27/17 0058  BILITOT 0.3 0.3  AST 20 18  ALT 11* 10*  ALKPHOS 87 67  PROT 4.9* 4.5*  ALBUMIN 2.0* 2.1*    TUMOR MARKERS: No results for input(s): AFPTM, CEA, CA199, CHROMGRNA in the last 8760 hours.  Assessment and Plan: GI Bleed Patient with history of duodenal ulcers and recent GI bleeding s/p EGD has continued to have bleeding at home after he left AMA 2 days ago.  Patient presents with decreased HgB.  Large volume hematemesis about 20 minutes ago per RN with several episodes of melena this AM.   Upon assessment, patient is sleepy.  Arouses and nods in response to questions, but quickly falls asleep.  He has baseline kidney dysfunction  and his SCr today is 2.71.  Currently anuric.  IR consulted for angiogram with possible embolization, however given medical status, decreased alertness, and especially renal function, would recommend GI consult prior to proceeding with angiogram to possibly preserve renal function if possible. IR available if needed.   Thank you for this interesting consult.  I greatly enjoyed meeting [facility] and look forward to participating in their care.  A copy of this report was sent to the requesting provider on this date.  Electronically Signed: Docia Barrier 01/27/2017, 10:46 AM   I spent a total of 40 Minutes    in face to face in clinical consultation, greater than 50% of which was counseling/coordinating care  for duodenal ulcer, GI bleed

## 2017-01-27 NOTE — Progress Notes (Signed)
  [place] TEAM 1 - Stepdown/ICU TEAM  Discussed case with Dr. Anselm Pancoast in IR.  He is willing to assist the pt in any way possible, but given the patient's acute kidney injury in the setting of chronic kidney disease embolization is felt to be relatively high risk for causing worsening kidney function.  We have agreed that we will reserve IR embolization as an absolute emergent intervention.    I have spoken with Dr. Carol Ada who is covering GI this weekend and explained the situation.  I have asked that he formally consult to determine if repeat endoscopic evaluation could potentially provide temporization if not definitive treatment of this issue with the benefit being no risk to the patient's kidney function.  He has graciously agreed to see the patient asap.    Cherene Altes, MD [facility] Office  [phone] Pager - Text Page per Amion as per below:  On-Call/Text Page:      [email or web address]      password [email or web address]  If 7PM-7AM, please contact night-coverage [email or web address] Password [facility] 01/27/2017, 10:52 AM

## 2017-01-27 NOTE — H&P (Signed)
History and Physical    [name] [name] MVE:[id number] DOB: 29-May-1961 DOA: 01/27/2017  PCP: Antonietta Jewel, MD   Patient coming from: Home  Chief Complaint: Lightheaded on standing, fatigue, nausea  HPI: [name] [name] is a 56 y.o. male with medical history significant for cholelithiasis, hypertension, type 2 diabetes mellitus, chronic kidney disease stage III, and duodenal ulcer with hemorrhage who left the hospital AMA on 01/25/2017 despite his critical illness and despite being warned that he could very well die, now returning with the same. He had been admitted on 01/24/2017 after being found unresponsive in his car sitting in a dark bloody stool with blood pressure of 80/50. He had been using Advil TID. He underwent EGD the following morning with identification of a large duodenal ulcer with hemorrhage and obstruction. A large visible vessel was injected with epinephrine and it was advised that the patient be continued on Protonix infusion. He reported feeling well the following morning, so he left the hospital, but shortly after returning home, has continued to have melena and has once again become fatigued, generally weak, and lightheaded upon standing. He is unable to provide any cogent or reasonably logical explanation for why he left yesterday. He denies fevers or chills and denies chest pain or palpitations. Denies bright red blood per rectum and also denies hematemesis. He denies using any NSAID, alcohol, or illicit substances since leaving the hospital.  ED Course: Upon arrival to the ED, patient is found to be afebrile, saturating well on room air, and with vital signs stable. Chemistry panels notable for a BUN of 35 and serum creatinine of 2.58, up from 2.0 two days earlier. CBC features a chronic leukocytosis to 18,700 and a normocytic anemia with hemoglobin of 5.2. Urinalysis is unremarkable. Patient was given 40 mg IV Protonix and 2 units of packed red blood cells were ordered for immediate  transfusion. The patient remained hemodynamically stable in the ED and will be admitted to the stepdown unit for ongoing evaluation and management of symptomatic anemia with acute upper GI bleed.  Review of Systems:  All other systems reviewed and apart from HPI, are negative.  Past Medical History:  Diagnosis Date  . 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 Diabetes mellitus without complication ([place])   . Gallstones   . Hypertension     Past Surgical History:  Procedure Laterality Date  . COLONOSCOPY  10/2016  . FRACTURE SURGERY     right ankle     reports that he has quit smoking. He has never used smokeless tobacco. He reports that he drinks alcohol. He reports that he does not use drugs.  No Known Allergies  Family History  Problem Relation Age of Onset  . Colon cancer Neg Hx      Prior to Admission medications   Medication Sig Start Date End Date Taking? Authorizing Provider  acetaminophen (TYLENOL) 325 MG tablet Take 2 tablets (650 mg total) by mouth every 6 (six) hours as needed for mild pain. Over the counter 01/25/17  Yes Ripudeep Krystal Eaton, MD  butalbital-acetaminophen-caffeine (FIORICET, ESGIC) 50-325-40 MG per tablet Take 1 tablet by mouth 2 (two) times daily as needed for headache.   Yes Historical Provider, MD  cyclobenzaprine (FLEXERIL) 10 MG tablet Take 10 mg by mouth 3 (three) times daily as needed for muscle spasms.   Yes Historical Provider, MD  diphenhydrAMINE (BENADRYL) 25 MG tablet Take 1 tablet (25 mg total) by mouth every 6 (six) hours. Patient taking differently: Take 25  mg by mouth every 6 (six) hours as needed for itching or allergies.  08/08/14  Yes Ripley Fraise, MD  gabapentin (NEURONTIN) 300 MG capsule Take 300 mg by mouth 3 (three) times daily as needed (pain).    Yes Historical Provider, MD  lovastatin (MEVACOR) 10 MG tablet Take 10 mg by mouth every morning.   Yes Historical Provider, MD  pantoprazole (PROTONIX) 40 MG  tablet Take 1 tablet (40 mg total) by mouth 2 (two) times daily before a meal. Before breakfast and supper 01/25/17  Yes Ripudeep Krystal Eaton, MD    Physical Exam: Vitals:   01/27/17 0054 01/27/17 0200 01/27/17 0230  BP: 124/68 (!) 119/57 (!) 119/55  Pulse: 72 66 64  Resp: 16 15 14   Temp: 98.4 F (36.9 C)    SpO2: 99% 100% 100%      Constitutional: NAD, calm, chronically-ill appearing.  Eyes: PERTLA, lids and conjunctivae normal ENMT: Mucous membranes are moist. Posterior pharynx clear of any exudate or lesions.   Neck: normal, supple, no masses, no thyromegaly Respiratory: clear to auscultation bilaterally, no wheezing, no crackles. Normal respiratory effort.   Cardiovascular: S1 & S2 heard, regular rate and rhythm. No significant JVD. Abdomen: No distension, soft, mild epigastric tenderness without rebound pain or guarding, no masses palpated. Bowel sounds active.  Musculoskeletal: no clubbing / cyanosis. No joint deformity upper and lower extremities.    Skin: no significant rashes, lesions, ulcers. Pale, poor turgor. Neurologic: CN 2-12 grossly intact. Sensation intact, DTR normal. Strength 5/5 in all 4 limbs.  Psychiatric: Alert and oriented x 3. Normal mood and affect.     Labs on Admission: I have personally reviewed following labs and imaging studies  CBC:  Recent Labs Lab 01/24/17 1926 01/25/17 0745 01/27/17 0058  WBC 17.4* 13.1* 18.7*  NEUTROABS 15.1*  --   --   HGB 4.7* 9.5* 5.2*  HCT 14.4* 28.2* 16.4*  MCV 86.2 87.0 88.2  PLT 332 282 462   Basic Metabolic Panel:  Recent Labs Lab 01/24/17 1926 01/25/17 0745 01/27/17 0058  NA 138 144 139  K 3.4* 3.9 3.5  CL 104 109 106  CO2 26 27 27   GLUCOSE 252* 103* 241*  BUN 50* 36* 35*  CREATININE 2.71* 2.03* 2.58*  CALCIUM 7.4* 8.1* 7.8*   GFR: CrCl cannot be calculated (Unknown ideal weight.). Liver Function Tests:  Recent Labs Lab 01/24/17 1926 01/27/17 0058  AST 20 18  ALT 11* 10*  ALKPHOS 87 67    BILITOT 0.3 0.3  PROT 4.9* 4.5*  ALBUMIN 2.0* 2.1*    Recent Labs Lab 01/24/17 1926 01/27/17 0058  LIPASE 21 25   No results for input(s): AMMONIA in the last 168 hours. Coagulation Profile:  Recent Labs Lab 01/24/17 1926  INR 1.28   Cardiac Enzymes: No results for input(s): CKTOTAL, CKMB, CKMBINDEX, TROPONINI in the last 168 hours. BNP (last 3 results) No results for input(s): PROBNP in the last 8760 hours. HbA1C: No results for input(s): HGBA1C in the last 72 hours. CBG:  Recent Labs Lab 01/25/17 0037 01/25/17 0654 01/25/17 1159  GLUCAP 152* 91 136*   Lipid Profile: No results for input(s): CHOL, HDL, LDLCALC, TRIG, CHOLHDL, LDLDIRECT in the last 72 hours. Thyroid Function Tests: No results for input(s): TSH, T4TOTAL, FREET4, T3FREE, THYROIDAB in the last 72 hours. Anemia Panel: No results for input(s): VITAMINB12, FOLATE, FERRITIN, TIBC, IRON, RETICCTPCT in the last 72 hours. Urine analysis:    Component Value Date/Time   COLORURINE YELLOW 01/27/2017  0100   APPEARANCEUR HAZY (A) 01/27/2017 0100   LABSPEC 1.019 01/27/2017 0100   PHURINE 5.0 01/27/2017 0100   GLUCOSEU NEGATIVE 01/27/2017 0100   HGBUR NEGATIVE 01/27/2017 0100   BILIRUBINUR NEGATIVE 01/27/2017 0100   KETONESUR 5 (A) 01/27/2017 0100   PROTEINUR 30 (A) 01/27/2017 0100   UROBILINOGEN 0.2 01/01/2011 1616   NITRITE NEGATIVE 01/27/2017 0100   LEUKOCYTESUR NEGATIVE 01/27/2017 0100   Sepsis Labs: @LABRCNTIP (procalcitonin:4,lacticidven:4) ) Recent Results (from the past 240 hour(s))  MRSA PCR Screening     Status: Abnormal   Collection Time: 01/25/17 12:40 AM  Result Value Ref Range Status   MRSA by PCR POSITIVE (A) NEGATIVE Final    Comment:        The GeneXpert MRSA Assay (FDA approved for NASAL specimens only), is one component of a comprehensive MRSA colonization surveillance program. It is not intended to diagnose MRSA infection nor to guide or monitor treatment for MRSA  infections. RESULT CALLED TO, READ BACK BY AND VERIFIED WITH: Yolanda Bonine @0230  01/25/17 MKELLY,MLT      Radiological Exams on Admission: No results found.  EKG: Not performed, will obtain as appropriate.    Assessment/Plan  1. PUD with acute blood-loss anemia  - Pt presents with continued melena, lightheadedness on standing, and fatigue  - Found to have Hgb of 5.2; Hgb was 4.7 at time of admission on 4/25, then up to 9.5 the following day after 4 units RBCs were transfused  - He has known PUD and a large visible vessel was injected with epi on 01/25/17  - He is hemodynamically stable on admission  - 2 units pRBCs ordered for transfusion; check post-transfusion CBC  - Resume Protonix infusion    2. Acute kidney injury superimposed on CKD stage III  - SCr is 2.58 on admission, up from 2.0 on 01/25/17  - Likely prerenal in setting of continued GI bleeding  - He will be started on NS infusion and will be transfused with 2 units pRBCs - Repeat chem panel in am    3. Hx of hypertension  - BP is a little soft on presentation; will hydrate with NS and transfuse  4. Type II DM  - A1c 8.7% in April 2012  - Managed with metformin at home, currently held  - Check CBG q4h while NPO and cover with low-intensity sliding-scale insulin  5. Leukocytosis - Leukocytosis to 18,700 noted on admission   - No fever or apparent source of infection   - Most likely reactive to the PUD with acute bleeding  - Culture if febrile    DVT prophylaxis: SCD's  Code Status: Full  Family Communication: Discussed with patient Disposition Plan: Admit to stepdown Consults called: None Admission status: Inpatient    Vianne Bulls, MD [facility] Pager [phone]  If 7PM-7AM, please contact night-coverage [email or web address] Password [email or web address]  01/27/2017, 3:01 AM

## 2017-01-27 NOTE — Progress Notes (Signed)
Progress note for [facility]  The patient returns to the hospital after leaving AMA yesterday.  He underwent an EGD with Dr. Carlean Purl on 01/25/2017 with findings of a large duodenal ulcer with a large visible vessel.  Epi injection was performed, but hemoclipping and BICAP were not performed secondary to the very large visible vessel.  He returns with an HGB of 5.2 g/dL with melena and significant hematemesis in the ICU.  IR evaluated the patient and his creatinine is too high at this time, but if necessary, IR can pursue embolization.    I have evaluated the patient and this is a critical situation.  I agree with the management by Dr. Carlean Purl in that hemoclipping and/or BICAP were not performed.  Attempts to directly treat the vessel would place the patient had high risk for exsanguination.  Epi was only a temporizing intervention.  Unfortunately the patient left the hospital AMA, even though it was clearly stated that he was at high risk for continued bleeding and death.    I will repeat his EGD now to obtain a real time visualization of the ulcer.  I will temporize the situation with Epi again.  It will be prudent to involve Surgery in this case in light of his elevated creatinine and its relatively contraindication for IR.

## 2017-01-27 NOTE — ED Triage Notes (Signed)
Pt arrives complaining of nausea and "gall bladder problems." Pt states seen here yesterday for same, was admitted to hospital. Pt states had gall bladder surgery and "refilled my blood." Pt left hospital AMA 2 days ago. Pt denies any emesis or diarrhea. Pt complaining of abdominal pain.

## 2017-01-27 NOTE — Progress Notes (Signed)
2 episodes of large bright red bloody emesis, 3 large dark tarry stools in the last 3 hours. Patient c/0 abdominal pain and nausea. zofran given. Alert and oriented. Vitals stable. Dr Dorothy Puffer notified.[name] [name] Awaiting cbc results after 2 units of prb'cs given.GI md to see patient soon per Dr Thereasa Solo.

## 2017-01-27 NOTE — Op Note (Signed)
[facility] Patient Name: [name] [name] Procedure Date : 01/27/2017 MRN: [id number] Attending MD: Carol Ada , MD Date of Birth: 04-23-1961 CSN: [id number] Age: 56 Admit Type: Inpatient Procedure:                Upper GI endoscopy Indications:              Hematemesis, Active gastrointestinal bleeding Providers:                Carol Ada, MD, Kingsley Plan, RN, Cherylynn Ridges, Technician Referring MD:              Medicines:                Fentanyl 25 micrograms IV, Midazolam 3 mg IV,                            Diphenhydramine 25 mg IV Complications:            No immediate complications. Estimated Blood Loss:     Estimated blood loss: none. Procedure:                Pre-Anesthesia Assessment:                           - Prior to the procedure, a History and Physical                            was performed, and patient medications and                            allergies were reviewed. The patient's tolerance of                            previous anesthesia was also reviewed. The risks                            and benefits of the procedure and the sedation                            options and risks were discussed with the patient.                            All questions were answered, and informed consent                            was obtained. Prior Anticoagulants: The patient has                            taken no previous anticoagulant or antiplatelet                            agents. ASA Grade Assessment: III - A patient with  severe systemic disease. After reviewing the risks                            and benefits, the patient was deemed in                            satisfactory condition to undergo the procedure.                           - Sedation was administered by an endoscopy nurse.                            The sedation level attained was moderate.                           After  obtaining informed consent, the endoscope was                            passed under direct vision. Throughout the                            procedure, the patient's blood pressure, pulse, and                            oxygen saturations were monitored continuously. The                            was introduced through the mouth, and advanced to                            the second part of duodenum. The upper GI endoscopy                            was accomplished without difficulty. The patient                            tolerated the procedure well. Scope In: Scope Out: Findings:      The esophagus was normal.      The stomach was normal.      One non-bleeding cratered duodenal ulcer with a visible vessel was found       in the duodenal bulb. The lesion was 30 mm in largest dimension. Area       was successfully injected with 4 mL of a 1:10,000 solution of       epinephrine for hemostasis. Estimated blood loss: none. To stop active       bleeding, two hemostatic clips were successfully placed (MR unsafe).       There was no bleeding at the end of the procedure.      Blood was noted in the esophagus and the gastric lumen and the blood was       suctioned to prevent aspiration. In the duodenum, at the transition       point between the duodenal bulb and D2 a large 3 cm cratered ulcer was       identified. Initially a clot was overlying the  ulcer, but with very       little manipulation the clot floated down distally. Clear visualization       of the ulcer bed was identified. A large visible vessel in the superior       portion of the ulcer was identified. With close observation and       consideration, the decision to hemoclip the vessel was executed. Slowly       the clip was closed over the vessel in a perpendicular approach, as this       was the only feasible direction. Concomitantly, the lumen was suctioned       in order to allow for better grasp of the base of the visible  vessel.       Deployment was successful and no bleeding was precipitated. In the       inferior portion of the ulcer a possible smaller secondary vessel was       identified. One hemoclip failed to attach to the ulcer bed. A second       attempt was made and this clip was more to the lateral edge of the       target area and deployment was successful. No bleeding was preciptated       and the procedure was concluded. Impression:               - Normal esophagus.                           - Normal stomach.                           - One non-bleeding duodenal ulcer with a visible                            vessel. Injected. Clips (MR unsafe) were placed.                           - No specimens collected. Moderate Sedation:      Moderate (conscious) sedation was administered by the endoscopy nurse       and supervised by the endoscopist. The following parameters were       monitored: oxygen saturation, heart rate, blood pressure, and response       to care. Recommendation:           - Return patient to hospital ward for ongoing care.                           - NPO.                           - Continue present medications - IV PPI. Okay to                            discontinue octreotide.                           - Follow HGB and transfuse as necessary.                           Surgical consultation in case severe bleeding  recurs. Procedure Code(s):        --- Professional ---                           [phone], Esophagogastroduodenoscopy, flexible,                            transoral; with control of bleeding, any method Diagnosis Code(s):        --- Professional ---                           K26.4, Chronic or unspecified duodenal ulcer with                            hemorrhage                           K92.0, Hematemesis                           K92.2, Gastrointestinal hemorrhage, unspecified CPT copyright 2016 [employer]. All rights  reserved. The codes documented in this report are preliminary and upon coder review may  be revised to meet current compliance requirements. Carol Ada, MD Carol Ada, MD 01/27/2017 1:38:01 PM This report has been signed electronically. Number of Addenda: 0

## 2017-01-27 NOTE — Progress Notes (Signed)
Fentanyl 6mcg and Versed 2 mg wasted with Santo Held, RN.

## 2017-01-28 LAB — COMPREHENSIVE METABOLIC PANEL
ALK PHOS: 44 U/L (ref 38–126)
ALT: 8 U/L — AB (ref 17–63)
AST: 13 U/L — ABNORMAL LOW (ref 15–41)
Albumin: 1.9 g/dL — ABNORMAL LOW (ref 3.5–5.0)
Anion gap: 3 — ABNORMAL LOW (ref 5–15)
BUN: 33 mg/dL — ABNORMAL HIGH (ref 6–20)
CALCIUM: 7.6 mg/dL — AB (ref 8.9–10.3)
CO2: 26 mmol/L (ref 22–32)
CREATININE: 2.04 mg/dL — AB (ref 0.61–1.24)
Chloride: 115 mmol/L — ABNORMAL HIGH (ref 101–111)
GFR calc non Af Amer: 34 mL/min — ABNORMAL LOW (ref >60)
GFR, EST AFRICAN AMERICAN: 40 mL/min — AB (ref >60)
Glucose, Bld: 133 mg/dL — ABNORMAL HIGH (ref 65–99)
Potassium: 3.8 mmol/L (ref 3.5–5.1)
Sodium: 144 mmol/L (ref 135–145)
Total Bilirubin: 0.3 mg/dL (ref 0.3–1.2)
Total Protein: 4.1 g/dL — ABNORMAL LOW (ref 6.5–8.1)

## 2017-01-28 LAB — TYPE AND SCREEN
ABO/RH(D): O POS
Antibody Screen: NEGATIVE
UNIT DIVISION: 0
UNIT DIVISION: 0
Unit division: 0
Unit division: 0

## 2017-01-28 LAB — GLUCOSE, CAPILLARY
GLUCOSE-CAPILLARY: 126 mg/dL — AB (ref 65–99)
GLUCOSE-CAPILLARY: 132 mg/dL — AB (ref 65–99)
GLUCOSE-CAPILLARY: 146 mg/dL — AB (ref 65–99)
Glucose-Capillary: 128 mg/dL — ABNORMAL HIGH (ref 65–99)
Glucose-Capillary: 146 mg/dL — ABNORMAL HIGH (ref 65–99)

## 2017-01-28 LAB — CBC
HCT: 26.2 % — ABNORMAL LOW (ref 39.0–52.0)
HEMATOCRIT: 28.7 % — AB (ref 39.0–52.0)
Hemoglobin: 8.9 g/dL — ABNORMAL LOW (ref 13.0–17.0)
Hemoglobin: 9.5 g/dL — ABNORMAL LOW (ref 13.0–17.0)
MCH: 28.7 pg (ref 26.0–34.0)
MCH: 28.5 pg (ref 26.0–34.0)
MCHC: 34 g/dL (ref 30.0–36.0)
MCHC: 33.1 g/dL (ref 30.0–36.0)
MCV: 84.5 fL (ref 78.0–100.0)
MCV: 86.2 fL (ref 78.0–100.0)
PLATELETS: 189 10*3/uL (ref 150–400)
PLATELETS: 239 10*3/uL (ref 150–400)
RBC: 3.1 MIL/uL — AB (ref 4.22–5.81)
RBC: 3.33 MIL/uL — AB (ref 4.22–5.81)
RDW: 14.4 % (ref 11.5–15.5)
RDW: 14.9 % (ref 11.5–15.5)
WBC: 18.7 10*3/uL — AB (ref 4.0–10.5)
WBC: 19.8 10*3/uL — ABNORMAL HIGH (ref 4.0–10.5)

## 2017-01-28 LAB — BPAM RBC
BLOOD PRODUCT EXPIRATION DATE: 201805182359
BLOOD PRODUCT EXPIRATION DATE: 201805242359
Blood Product Expiration Date: 201805192359
Blood Product Expiration Date: 201805242359
ISSUE DATE / TIME: 201804280546
ISSUE DATE / TIME: 201804281701
ISSUE DATE / TIME: 201804280820
ISSUE DATE / TIME: 201804281431
UNIT TYPE AND RH: 5100
UNIT TYPE AND RH: 5100
Unit Type and Rh: 5100
Unit Type and Rh: 5100

## 2017-01-28 MED ORDER — METOPROLOL TARTRATE 5 MG/5ML IV SOLN
5.0000 mg | Freq: Four times a day (QID) | INTRAVENOUS | Status: DC
  Administered 2017-01-28 – 2017-01-29 (×3): 5 mg via INTRAVENOUS
  Filled 2017-01-28 (×4): qty 5

## 2017-01-28 MED ORDER — MUPIROCIN 2 % EX OINT
1.0000 "application " | TOPICAL_OINTMENT | Freq: Two times a day (BID) | CUTANEOUS | Status: DC
  Administered 2017-01-28 – 2017-01-29 (×3): 1 via NASAL
  Filled 2017-01-28 (×2): qty 22

## 2017-01-28 MED ORDER — ORAL CARE MOUTH RINSE: 15.0000 mL | Freq: Two times a day (BID) | OROMUCOSAL | Status: DC

## 2017-01-28 MED ORDER — CHLORHEXIDINE GLUCONATE CLOTH 2 % EX PADS
6.0000 | MEDICATED_PAD | Freq: Every day | CUTANEOUS | Status: DC
  Administered 2017-01-28: 6 via TOPICAL

## 2017-01-28 MED ORDER — KCL IN DEXTROSE-NACL 20-5-0.9 MEQ/L-%-% IV SOLN
INTRAVENOUS | Status: DC
  Administered 2017-01-28: 1000 mL via INTRAVENOUS
  Administered 2017-01-29: 05:00:00 via INTRAVENOUS
  Filled 2017-01-28 (×2): qty 1000

## 2017-01-28 NOTE — Progress Notes (Signed)
[facility] TEAM 1 - Stepdown/ICU TEAM  Peter Keyworth  CHY:[id number] DOB: May 15, 1961 DOA: 01/27/2017 PCP: Antonietta Jewel, MD    Brief Narrative:  56 y.o. male with history of cholelithiasis, HTN, DM2, chronic kidney disease stage III, and duodenal ulcer with hemorrhage who left the hospital AMA on 01/25/2017 despite his critical illness and despite being warned that he could very well die. He had been admitted on 01/24/2017 after being found unresponsive in his car sitting in a dark bloody stool with blood pressure of 80/50. He had been using Advil TID. He underwent EGD the following morning with identification of a large duodenal ulcer with hemorrhage. A large visible vessel was injected with epinephrine and it was advised that the patient be continued on Protonix infusion. He reported feeling well the following morning, so he left the hospital, but shortly after returning home, has continued to have melena and once again became fatigued, generally weak, and lightheaded upon standing. He was unable to provide any cogent or reasonably logical explanation for why he left.   Upon arrival to the ED CBC featured a hemoglobin of 5.2.   Subjective: Pt is now s/p life saving endoscopy w/ endoclip deployment to treat bleeding large vessel in the base of a duodenal ulcer. There is no clinical evidence of ongoing bleeding at this time.    Pt is sleepy and interacts only minimally this morning.  He denies cp, sob, or vomiting.  He is having melena, but no hematochezia or hematemesis.     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 - melena persists as expected, but no loss of bright red blood noted - a large visible vessel was injected with epi on 01/25/17, but bleeding recurred after pt left AMA - 4/28 the pt underwent repeat emergent EGD w/ endoclip being utilized to arrest his active bleeding - cont protonix infusion - not felt to be a good IR candidate due to kidney disease - Gen  Surgery has been informed of his presence and is available on standby should brisk bleeding resume - appears stable at this time   Acute blood-loss anemia  - found to have Hgb of 5.2 at re-presentation - Hgb was 4.7 at time of admission on 4/25, then up to 9.5 after 4 units RBCs - 4 additional units pRBCs transfused thus far this admission - Hgb holding steady for now   Recent Labs Lab 01/25/17 0745 01/27/17 0058 01/27/17 1133 01/27/17 2126 01/28/17 0215  HGB 9.5* 5.2* 7.7* 9.0* 8.9*    Acute kidney injury superimposed on CKD stage III  - Cr 2.58 on admission, up from 2.0 on 01/25/17 - likely prerenal in setting of continued GI bleeding - baseline creatinine reportedly 1.3-1.5 - renal fxn improving w/ volume resuscitation - cont to follow   Recent Labs Lab 01/24/17 1926 01/25/17 0745 01/27/17 0058 01/27/17 0426 01/28/17 0215  CREATININE 2.71* 2.03* 2.58* 2.19* 2.04*    Hypertension  - BP not at goal at present - resume medical tx now that Hgb stable   Type II DM  - follow w/ SSI while NPO - reasonably controlled at present   MRSA screen +  DVT prophylaxis: SCDs Code Status: FULL CODE Family Communication: no family present at time of exam  Disposition Plan: remain in SDU due to high risk for life threatening hemorrhagic shock - if stable over night will plan for transfer to med bed in AM  Consultants:  GI - [facility] Gen Surgery  Procedures: 4/28 EGD w/ endoclip deployment   Antimicrobials:  none  Objective: Blood pressure (!) 143/67, pulse 76, temperature 98 F (36.7 C), temperature source Oral, resp. rate (!) 22, height 5\' 11"  (1.803 m), weight 65.8 kg (145 lb 1 oz), SpO2 98 %.  Intake/Output Summary (Last 24 hours) at 01/28/17 0839 Last data filed at 01/28/17 0600  Gross per 24 hour  Intake             2810 ml  Output              953 ml  Net             1857 ml   Filed Weights   01/27/17 0541 01/28/17 0256  Weight: 66.9 kg (147 lb 7.8 oz)  65.8 kg (145 lb 1 oz)    Examination: General: No acute respiratory distress Lungs: Clear to auscultation bilaterally - no wheezing  Cardiovascular: Regular rate and rhythm without murmur  Abdomen: Nontender, nondistended, soft, bowel sounds positive, no rebound Extremities: No significant edema bilateral lower extremities    CBC:  Recent Labs Lab 01/24/17 1926 01/25/17 0745 01/27/17 0058 01/27/17 1133 01/27/17 2126 01/28/17 0215  WBC 17.4* 13.1* 18.7* 19.6* 19.8* 18.7*  NEUTROABS 15.1*  --   --  16.6*  --   --   HGB 4.7* 9.5* 5.2* 7.7* 9.0* 8.9*  HCT 14.4* 28.2* 16.4* 23.1* 26.6* 26.2*  MCV 86.2 87.0 88.2 87.2 84.4 84.5  PLT 332 282 266 227 170 631   Basic Metabolic Panel:  Recent Labs Lab 01/24/17 1926 01/25/17 0745 01/27/17 0058 01/27/17 0426 01/28/17 0215  NA 138 144 139 139 144  K 3.4* 3.9 3.5 4.2 3.8  CL 104 109 106 107 115*  CO2 26 27 27 25 26   GLUCOSE 252* 103* 241* 248* 133*  BUN 50* 36* 35* 37* 33*  CREATININE 2.71* 2.03* 2.58* 2.19* 2.04*  CALCIUM 7.4* 8.1* 7.8* 7.7* 7.6*   GFR: Estimated Creatinine Clearance: 37.2 mL/min (A) (by C-G formula based on SCr of 2.04 mg/dL (H)).  Liver Function Tests:  Recent Labs Lab 01/24/17 1926 01/27/17 0058 01/28/17 0215  AST 20 18 13*  ALT 11* 10* 8*  ALKPHOS 87 67 44  BILITOT 0.3 0.3 0.3  PROT 4.9* 4.5* 4.1*  ALBUMIN 2.0* 2.1* 1.9*    Recent Labs Lab 01/24/17 1926 01/27/17 0058  LIPASE 21 25    Coagulation Profile:  Recent Labs Lab 01/24/17 1926 01/27/17 1133  INR 1.28 1.31    HbA1C: Hgb A1c MFr Bld  Date/Time Value Ref Range Status  01/02/2011 05:03 AM (H) <5.7 % Final   8.7 (NOTE)                                                                       According to the ADA Clinical Practice Recommendations for 2011, when HbA1c is used as a screening test:   >=6.5%   Diagnostic of Diabetes Mellitus           (if abnormal result  is confirmed)  5.7-6.4%   Increased risk of developing  Diabetes Mellitus  References:Diagnosis and Classification of Diabetes Mellitus,Diabetes SHFW,2637,85(YIFOY 1):S62-S69 and Standards of Medical Care in         Diabetes -  2011,Diabetes OZYY,4825,00  (Suppl 1):S11-S61.    CBG:  Recent Labs Lab 01/27/17 1559 01/27/17 1950 01/27/17 2352 01/28/17 0412 01/28/17 0751  GLUCAP 242* 128* 126* 128* 146*    Recent Results (from the past 240 hour(s))  MRSA PCR Screening     Status: Abnormal   Collection Time: 01/25/17 12:40 AM  Result Value Ref Range Status   MRSA by PCR POSITIVE (A) NEGATIVE Final    Comment:        The GeneXpert MRSA Assay (FDA approved for NASAL specimens only), is one component of a comprehensive MRSA colonization surveillance program. It is not intended to diagnose MRSA infection nor to guide or monitor treatment for MRSA infections. RESULT CALLED TO, READ BACK BY AND VERIFIED WITH: Yolanda Bonine @0230  01/25/17 MKELLY,MLT      Scheduled Meds: . Chlorhexidine Gluconate Cloth  6 each Topical Q0600  . insulin aspart  0-9 Units Subcutaneous Q4H  . mupirocin ointment  1 application Nasal BID  . [START ON 01/30/2017] pantoprazole  40 mg Intravenous Q12H  . sodium chloride flush  3 mL Intravenous Q12H   Continuous Infusions: . sodium chloride    . sodium chloride 75 mL/hr at 01/27/17 1940  . sodium chloride    . pantoprozole (PROTONIX) infusion 8 mg/hr (01/28/17 0255)     LOS: 1 day    Cherene Altes, MD [facility] Office  [phone] Pager - Text Page per Amion as per below:  On-Call/Text Page:      [email or web address]      password [email or web address]  If 7PM-7AM, please contact night-coverage [email or web address] Password [facility] 01/28/2017, 8:39 AM

## 2017-01-28 NOTE — Progress Notes (Signed)
Subjective: No acute events.  Nursing did report melenic stool, but no further hematochezia or hematemesis.  Objective: Vital signs in last 24 hours: Temp:  [97.9 F (36.6 C)-98.8 F (37.1 C)] 98 F (36.7 C) (04/29 0748) Pulse Rate:  [46-106] 76 (04/29 0600) Resp:  [0-32] 22 (04/29 0600) BP: (103-175)/(52-108) 143/67 (04/29 0600) SpO2:  [84 %-100 %] 98 % (04/29 0600) Weight:  [65.8 kg (145 lb 1 oz)] 65.8 kg (145 lb 1 oz) (04/29 0256) Last BM Date: 01/27/17  Intake/Output from previous day: 04/28 0701 - 04/29 0700 In: 3126.2 [I.V.:2107.5; Blood:1018.7] Out: 954 [Urine:950; Emesis/NG output:2; Stool:2] Intake/Output this shift: No intake/output data recorded.  General appearance: uncooperative Resp: clear to auscultation bilaterally Cardio: regular rate and rhythm GI: some epigastric tenderness Extremities: extremities normal, atraumatic, no cyanosis or edema  Lab Results:  Recent Labs  01/27/17 1133 01/27/17 2126 01/28/17 0215  WBC 19.6* 19.8* 18.7*  HGB 7.7* 9.0* 8.9*  HCT 23.1* 26.6* 26.2*  PLT 227 170 189   BMET  Recent Labs  01/27/17 0058 01/27/17 0426 01/28/17 0215  NA 139 139 144  K 3.5 4.2 3.8  CL 106 107 115*  CO2 27 25 26   GLUCOSE 241* 248* 133*  BUN 35* 37* 33*  CREATININE 2.58* 2.19* 2.04*  CALCIUM 7.8* 7.7* 7.6*   LFT  Recent Labs  01/28/17 0215  PROT 4.1*  ALBUMIN 1.9*  AST 13*  ALT 8*  ALKPHOS 44  BILITOT 0.3   PT/INR  Recent Labs  01/27/17 1133  LABPROT 16.3*  INR 1.31   Hepatitis Panel No results for input(s): HEPBSAG, HCVAB, HEPAIGM, HEPBIGM in the last 72 hours. C-Diff No results for input(s): CDIFFTOX in the last 72 hours. Fecal Lactopherrin No results for input(s): FECLLACTOFRN in the last 72 hours.  Studies/Results: No results found.  Medications:  Scheduled: . Chlorhexidine Gluconate Cloth  6 each Topical Q0600  . insulin aspart  0-9 Units Subcutaneous Q4H  . mupirocin ointment  1 application Nasal BID  .  [START ON 01/30/2017] pantoprazole  40 mg Intravenous Q12H  . sodium chloride flush  3 mL Intravenous Q12H   Continuous: . sodium chloride    . sodium chloride 75 mL/hr at 01/27/17 1940  . sodium chloride    . pantoprozole (PROTONIX) infusion 8 mg/hr (01/28/17 0255)    Assessment/Plan: 1) Large bleeding duodenal ulcer secondary to a visible vessel s/p hemoclipping. 2) Anemia - Stable.   His HGB is stable at 8.9 g/dL.  He is a difficult patient with regards to his demeanor.  Medically he is headed in the right direction.  I want him to maintain his NPO status in light of the ulcer.  He will need to be on a PPI BID x 1 month and then QD indefinitely.  Dr. Carlean Purl did biopsy him to check for H. Pylori and this needs to be followed up.  Plan: 1) Continue with BID pantoprazole. 2) Maintain NPO.  Most likely advance to clears tomorrow. 3) [facility] to resume care in the AM.  LOS: 1 day   [name] [name] D 01/28/2017, 8:06 AM

## 2017-01-28 NOTE — Plan of Care (Signed)
Problem: Safety: Goal: Ability to remain free from injury will improve Outcome: Progressing Pt understands need to call nurse for assistance tele sitter in room

## 2017-01-28 NOTE — Progress Notes (Addendum)
Gave report to SDU 4E, and took pt to room in a wheelchair. Pt tolerated well, no concerns.  No signs of active bleeding noticed today.  [name] [name]

## 2017-01-29 ENCOUNTER — Encounter (HOSPITAL_COMMUNITY): Payer: Self-pay | Admitting: Internal Medicine

## 2017-01-29 LAB — CBC
HCT: 25.2 % — ABNORMAL LOW (ref 39.0–52.0)
HEMOGLOBIN: 8.6 g/dL — AB (ref 13.0–17.0)
MCH: 29.5 pg (ref 26.0–34.0)
MCHC: 34.1 g/dL (ref 30.0–36.0)
MCV: 86.3 fL (ref 78.0–100.0)
Platelets: 214 10*3/uL (ref 150–400)
RBC: 2.92 MIL/uL — AB (ref 4.22–5.81)
RDW: 15.2 % (ref 11.5–15.5)
WBC: 15.6 10*3/uL — ABNORMAL HIGH (ref 4.0–10.5)

## 2017-01-29 LAB — COMPREHENSIVE METABOLIC PANEL
ALBUMIN: 2 g/dL — AB (ref 3.5–5.0)
ALK PHOS: 45 U/L (ref 38–126)
ALT: 8 U/L — ABNORMAL LOW (ref 17–63)
ANION GAP: 3 — AB (ref 5–15)
AST: 15 U/L (ref 15–41)
BUN: 19 mg/dL (ref 6–20)
CALCIUM: 7.5 mg/dL — AB (ref 8.9–10.3)
CO2: 26 mmol/L (ref 22–32)
Chloride: 115 mmol/L — ABNORMAL HIGH (ref 101–111)
Creatinine, Ser: 1.6 mg/dL — ABNORMAL HIGH (ref 0.61–1.24)
GFR calc non Af Amer: 46 mL/min — ABNORMAL LOW (ref >60)
GFR, EST AFRICAN AMERICAN: 54 mL/min — AB (ref >60)
GLUCOSE: 140 mg/dL — AB (ref 65–99)
POTASSIUM: 3.4 mmol/L — AB (ref 3.5–5.1)
SODIUM: 144 mmol/L (ref 135–145)
TOTAL PROTEIN: 4.1 g/dL — AB (ref 6.5–8.1)
Total Bilirubin: 0.4 mg/dL (ref 0.3–1.2)

## 2017-01-29 LAB — HEMOGLOBIN A1C
HEMOGLOBIN A1C: 6 % — AB (ref 4.8–5.6)
Mean Plasma Glucose: 126 mg/dL

## 2017-01-29 NOTE — Progress Notes (Signed)
Patient ID: [name] [name], male   DOB: [birth year], 56 y.o.   MRN: [id number]   Patient hospitalized for bleeding duodenal ulcer - managed by GI.  Scheduled for elective laparoscopic cholecystectomy May 17.  If GI feels that this is too early, please let me know so I can get it rescheduled.  No acute surgical indications at this time.  [name]. Georgette Dover, MD, [facility] Surgery  General/ Trauma Surgery  01/29/2017 9:02 AM

## 2017-01-29 NOTE — Care Management Note (Signed)
Case Management Note  Patient Details  Name: Shon Indelicato MRN: [id number] Date of Birth: [birth year]  Subjective/Objective:   Left AMA.                 Action/Plan:   Expected Discharge Date:                  Expected Discharge Plan:  Home/Self Care  In-House Referral:     Discharge planning Services  CM Consult  Post Acute Care Choice:    Choice offered to:     DME Arranged:    DME Agency:     HH Arranged:    HH Agency:     Status of Service:  Completed, signed off  If discussed at [employer] of Stay Meetings, dates discussed:    Additional Comments:  Zenon Mayo, RN 01/29/2017, 2:10 PM

## 2017-01-29 NOTE — Progress Notes (Signed)
Pt dress self. Pt leaving AMA explained to pt reason why should stay, refused states he is going home. Gave pt belongings and valuables from security $60.

## 2017-01-29 NOTE — Discharge Summary (Signed)
PT LEFT AMA SUMMARY  [name] [name] MRN - [id number] DOB - 01/09/1961  Date of Admission - 01/27/2017 Date LEFT AMA: 01/29/2017  Attending Physician:  Joette Catching T  Patient's PCP:  [name] Jewel, MD  Disposition: LEFT AMA  Follow-up Appts:  Not able to be arranged or discussed as pt LEFT AMA  Diagnoses at time pt LEFT AMA: GIB due to chronic duodenal ulcer w/ acute exposed vessel Acute blood-loss anemia - severe/life-threatening  Acute kidney injury superimposed on CKD stage III  Hypertension  Type II DM  MRSA screen +  Initial presentation: 56 y.o.malewith history of cholelithiasis, HTN, DM2, chronic kidney disease stage III, and duodenal ulcer with hemorrhage who left the hospital AMA on 01/25/2017 despite his critical illness and despite being warned that he could very well die. He had been admitted on 01/24/2017 after being found unresponsive in his car sitting in a dark bloody stool with blood pressure of 80/50. He had been using Advil TID. He underwent EGD the following morning with identification of a large duodenal ulcer with hemorrhage. A large visible vessel was injected with epinephrine and it was advised that the patient be continued on Protonix infusion. He reportedfeeling well the following morning,so he left the hospital, but shortly after returning home continued to have melena and once again became fatigued, generally weak, and lightheaded upon standing. He was unable to provide anycogent or reasonably logical explanation for why he left.   Upon arrival to the ED CBC featured a hemoglobin of 5.2.   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 - a large visible vessel was injected with epi on 01/25/17, but bleeding recurred after pt left AMA - 4/28 the pt underwent repeat emergent EGD w/ endoclip being utilized to arrest his active bleeding - protonix infusion was being administered - not felt to be a good IR candidate due to kidney  disease - Gen Surgery was informed of his presence and was available on standby should brisk bleeding resume - while the patient was clinically stabilizing the intention was to complete his full course of IV Protonix, very slowly advance his diet, and to assure that he did not experience rebleeding - on the morning of 4/30 the patient informed his nurse that he was leaving AMA and had already dressed himself - the physician was contacted but was attending to another patient in the intensive care unit and was unable to report to the floor immediately - the physician had multiple prior discussions with the patient explaining the fact that his bleeding was life-threatening and that any attempts to leave the hospital before his treatment was completed would put him at high risk of dying - the patient's nurse confirmed that he was alert and oriented 4 - the patient's nurse also warned him that he was putting his life in danger - nonetheless the patient left AMA before his physician could get free to speak to him  Acute blood-loss anemia  - found to have Hgb of 5.2 at re-presentation - Hgb was 4.7 at time of admission on 4/25, then up to 9.5 after 4 units RBCs - 4 additional units pRBCs transfused this admission - Hgb holding relatively stable at time pt left AMA, but should his endoclip become dislodged this would rapidly change    Acute kidney injury superimposed on CKD stage III  - Cr 2.58 on admission, up from 2.0 on 01/25/17 - likely prerenal in setting of continued GI bleeding - baseline creatinine reportedly  1.3-1.5 - renal fxn improved w/ volume resuscitation    Hypertension  - BP not at goal but pt would not stay to allow further medication titration   Type II DM   MRSA screen +    Medication List    Unable to be finalized as pt LEFT AMA  Day of Discharge Wt Readings from Last 3 Encounters:  01/28/17 65.8 kg (145 lb 1 oz)  10/16/16 68.9 kg (151 lb 14.4 oz)  10/06/16 72.6 kg (160 lb)    Temp Readings from Last 3 Encounters:  01/29/17 98.4 F (36.9 C) (Oral)  01/25/17 98.6 F (37 C) (Axillary)  10/16/16 97.9 F (36.6 C) (Oral)   BP Readings from Last 3 Encounters:  01/29/17 (!) 144/75  01/25/17 139/72  10/16/16 (!) 171/108   Pulse Readings from Last 3 Encounters:  01/29/17 73  01/25/17 72  10/16/16 64    Physical Exam: Exam not able to be completed at time of d/c as pt LEFT AMA  10:16 AM 01/29/17  Cherene Altes, MD [facility] Office  [phone] Pager [phone]  On-Call/Text Page:      [email or web address]      password [facility]

## 2017-01-30 NOTE — Progress Notes (Signed)
Negative for H pylori Is in hospital or was No letter needed

## 2017-02-07 ENCOUNTER — Ambulatory Visit: Payer: Self-pay | Admitting: Surgery

## 2017-02-07 NOTE — H&P (Signed)
  History of Present Illness  The patient is a 56 year old male who presents with abdominal pain. Referred by Dr. Lillia Corporal for abdominal pain/ acalculus cholecystitis  This is a 56 year old male with diabetes and hypertension who presents with a two-month history of intermittent postprandial abdominal pain. This is associated with significant nausea. The patient also reports some constipation requiring the use of laxatives. The pain seems relatively constant but is exacerbated by eating. He was initially evaluated in early March and underwent ultrasound. Ultrasound shows gallbladder wall thickening up to 8 mm with no evidence of gallstones. Common bile duct was within normal limits. I do not see any sign of liver function testing. He was given the diagnosis of acalculous cholecystitis and is referred for surgical evaluation. Patient also reports some back pain but he does have some degenerative disc disease.     Diagnostic Studies History  Colonoscopy  never  Allergies  No Known Drug Allergies 01/15/2017 Allergies Reconciled   Medication History  Meloxicam (15MG  Tablet, Oral) Active. HydrALAZINE HCl (50MG  Tablet, Oral) Active. Atenolol (50MG  Tablet, Oral) Active. AmLODIPine Besylate (10MG  Tablet, Oral) Active. Tylenol/Codeine #2 (300-15MG  Tablet, Oral) Active. Medications Reconciled  Social History Alcohol use  Occasional alcohol use. No caffeine use  No drug use  Tobacco use  Never smoker.  Family History  Diabetes Mellitus  Father, Mother, Sister. Hypertension  Father, Mother, Sister.  Other Problems  Diabetes Mellitus  High blood pressure     Review of Systems General Present- Weight Loss. Not Present- Appetite Loss, Chills, Fatigue, Fever, Night Sweats and Weight Gain. Skin Present- Hives and Rash. Not Present- Change in Wart/Mole, Dryness, Jaundice, New Lesions, Non-Healing Wounds and Ulcer. HEENT Present- Earache. Not Present- Hearing  Loss, Hoarseness, Nose Bleed, Oral Ulcers, Ringing in the Ears, Seasonal Allergies, Sinus Pain, Sore Throat, Visual Disturbances, Wears glasses/contact lenses and Yellow Eyes. Gastrointestinal Present- Difficulty Swallowing. Not Present- Abdominal Pain, Bloating, Bloody Stool, Change in Bowel Habits, Chronic diarrhea, Constipation, Excessive gas, Gets full quickly at meals, Hemorrhoids, Indigestion, Nausea, Rectal Pain and Vomiting.  Vitals  Weight: 157.6 lb Height: 71in Body Surface Area: 1.91 m Body Mass Index: 21.98 kg/m  Temp.: 98.20F  Pulse: 65 (Regular)  BP: 120/80 (Sitting, Left Arm, Standard)       Physical Exam  The physical exam findings are as follows: Note:WDWN in NAD Eyes: Pupils equal, round; sclera anicteric HENT: Oral mucosa moist; good dentition Neck: No masses palpated, no thyromegaly Lungs: CTA bilaterally; normal respiratory effort CV: Regular rate and rhythm; no murmurs; extremities well-perfused with no edema Abd: +bowel sounds, soft, mild RUQ tenderness, no palpable organomegaly; Healed upper midline incision with small palpable umbilical hernia Skin: Warm, dry; no sign of jaundice Psychiatric - alert and oriented x 4; calm mood and affect    Assessment & Plan  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 The surgical procedure has been discussed with the patient. Potential risks, benefits, alternative treatments, and expected outcomes have been explained. All of the patient's questions at this time have been answered. The likelihood of reaching the patient's treatment goal is good. The patient understand the proposed surgical procedure and wishes to proceed.   [name]. Georgette Dover, MD, [facility] Surgery  General/ Trauma Surgery  02/07/2017 11:18 AM

## 2017-02-12 ENCOUNTER — Encounter (HOSPITAL_COMMUNITY): Admission: RE | Admit: 2017-02-12 | Payer: No Typology Code available for payment source | Source: Ambulatory Visit

## 2017-02-15 ENCOUNTER — Ambulatory Visit (HOSPITAL_COMMUNITY): Admission: RE | Admit: 2017-02-15 | Payer: BLUE CROSS/BLUE SHIELD | Source: Ambulatory Visit | Admitting: Surgery

## 2017-02-15 SURGERY — LAPAROSCOPIC CHOLECYSTECTOMY WITH INTRAOPERATIVE CHOLANGIOGRAM
Anesthesia: General

## 2017-03-02 DEATH — deceased

## 2017-12-08 IMAGING — CR DG CHEST 1V PORT
1 series · 1 of 1 positions shown · non-contrast
Comparison: Chest radiograph performed 10/31/2006

CLINICAL DATA: Acute onset of nausea, vomiting and black stools. GI
bleeding. Initial encounter.

EXAM:
PORTABLE CHEST 1 VIEW

[AP]
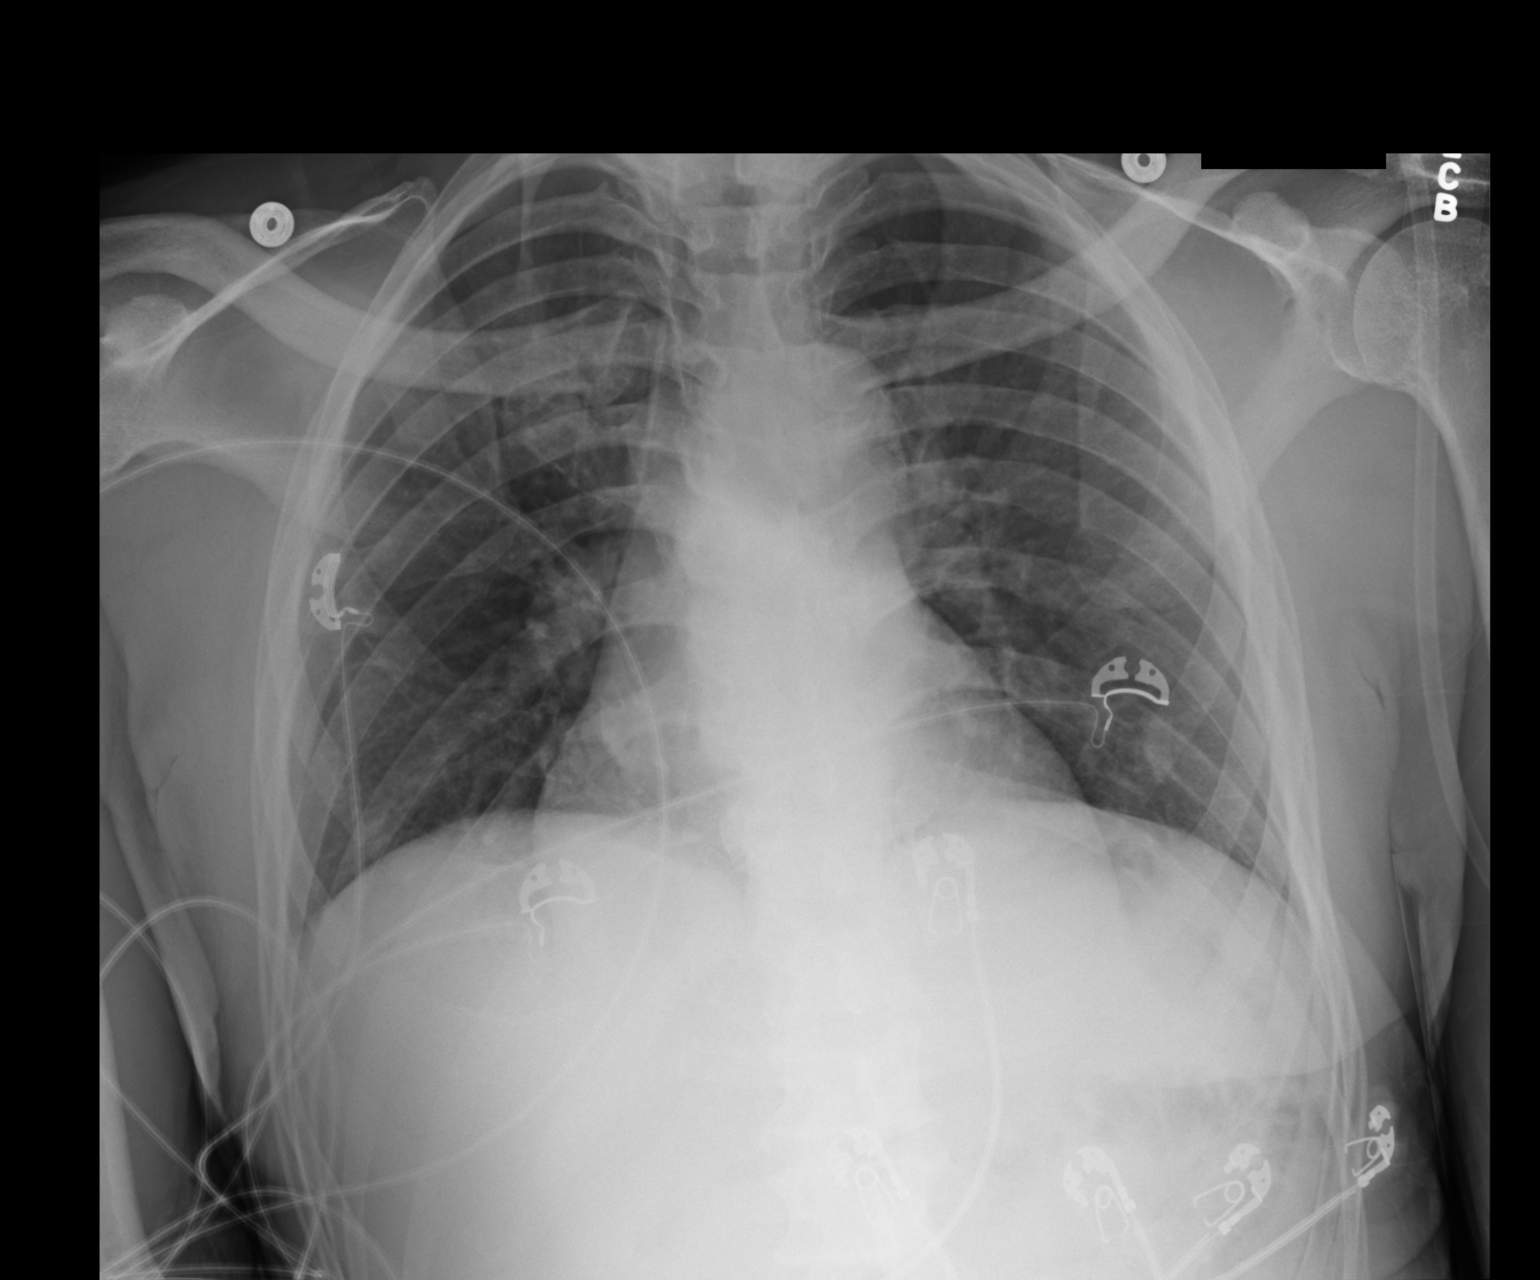

[1 of 1 positions shown; findings below may reference images not displayed]

FINDINGS: The lungs are well-aerated and clear. There is no evidence of focal
opacification, pleural effusion or pneumothorax.

The cardiomediastinal silhouette is borderline enlarged. No acute
osseous abnormalities are seen.
IMPRESSION: Borderline cardiomegaly.  Lungs remain grossly clear.
